# Patient Record
Sex: Female | Born: 1935 | Race: White | Hispanic: No | Smoking: Never smoker
Health system: Southern US, Community
[De-identification: ages and names within clinical notes are randomized; demographics above are authoritative.]

## PROBLEM LIST (undated history)

## (undated) DIAGNOSIS — I10 Essential (primary) hypertension: Secondary | ICD-10-CM

## (undated) DIAGNOSIS — Z22322 Carrier or suspected carrier of Methicillin resistant Staphylococcus aureus: Secondary | ICD-10-CM

## (undated) DIAGNOSIS — Z9289 Personal history of other medical treatment: Secondary | ICD-10-CM

## (undated) DIAGNOSIS — G459 Transient cerebral ischemic attack, unspecified: Secondary | ICD-10-CM

## (undated) DIAGNOSIS — Z950 Presence of cardiac pacemaker: Secondary | ICD-10-CM

## (undated) DIAGNOSIS — E079 Disorder of thyroid, unspecified: Secondary | ICD-10-CM

## (undated) DIAGNOSIS — E785 Hyperlipidemia, unspecified: Secondary | ICD-10-CM

## (undated) DIAGNOSIS — H548 Legal blindness, as defined in USA: Secondary | ICD-10-CM

## (undated) DIAGNOSIS — F0390 Unspecified dementia without behavioral disturbance: Secondary | ICD-10-CM

## (undated) DIAGNOSIS — I495 Sick sinus syndrome: Secondary | ICD-10-CM

## (undated) HISTORY — DX: Sick sinus syndrome: I49.5

## (undated) HISTORY — DX: Personal history of other medical treatment: Z92.89

---

## 2007-07-07 ENCOUNTER — Inpatient Hospital Stay (HOSPITAL_COMMUNITY): Admission: EM | Admit: 2007-07-07 | Discharge: 2007-07-13 | Payer: Self-pay | Admitting: Emergency Medicine

## 2007-07-08 ENCOUNTER — Encounter (INDEPENDENT_AMBULATORY_CARE_PROVIDER_SITE_OTHER): Payer: Self-pay | Admitting: Emergency Medicine

## 2007-07-11 ENCOUNTER — Ambulatory Visit: Payer: Self-pay | Admitting: Surgery

## 2007-07-11 ENCOUNTER — Encounter (INDEPENDENT_AMBULATORY_CARE_PROVIDER_SITE_OTHER): Payer: Self-pay | Admitting: Internal Medicine

## 2007-08-04 DIAGNOSIS — I495 Sick sinus syndrome: Secondary | ICD-10-CM

## 2007-08-04 HISTORY — DX: Sick sinus syndrome: I49.5

## 2007-09-28 ENCOUNTER — Inpatient Hospital Stay (HOSPITAL_COMMUNITY): Admission: EM | Admit: 2007-09-28 | Discharge: 2007-10-01 | Payer: Self-pay | Admitting: Emergency Medicine

## 2007-09-28 ENCOUNTER — Ambulatory Visit (HOSPITAL_COMMUNITY): Admission: RE | Admit: 2007-09-28 | Discharge: 2007-09-28 | Payer: Self-pay | Admitting: Family Medicine

## 2007-09-29 HISTORY — PX: PACEMAKER PLACEMENT: SHX43

## 2007-11-25 ENCOUNTER — Encounter: Admission: RE | Admit: 2007-11-25 | Discharge: 2007-11-25 | Payer: Self-pay | Admitting: Family Medicine

## 2008-03-27 ENCOUNTER — Encounter: Admission: RE | Admit: 2008-03-27 | Discharge: 2008-03-27 | Payer: Self-pay | Admitting: Neurology

## 2008-07-12 DIAGNOSIS — Z9289 Personal history of other medical treatment: Secondary | ICD-10-CM

## 2008-07-12 HISTORY — DX: Personal history of other medical treatment: Z92.89

## 2009-10-22 ENCOUNTER — Encounter: Admission: RE | Admit: 2009-10-22 | Discharge: 2009-10-22 | Payer: Self-pay | Admitting: Cardiovascular Disease

## 2010-12-16 NOTE — Discharge Summary (Signed)
Grace Silva, MULKERN NO.:  [id number]   MEDICAL RECORD NO.:  [id number]          PATIENT TYPE:  INP   LOCATION:  4729                         FACILITY:  [facility]   PHYSICIAN:  Wilson Singer, M.D.DATE OF BIRTH:  1918/10/29   DATE OF ADMISSION:  07/07/2007  DATE OF DISCHARGE:  07/13/2007                               DISCHARGE SUMMARY   FINAL DISCHARGE DIAGNOSES:  1. Syncope possibly related to sick sinus syndrome.  2. Large chronic right posterior communicating artery territory      stroke.  3. Hypothyroidism.   CONDITION ON DISCHARGE:  Stable.   MEDICATIONS ON DISCHARGE:  1. Norvasc 5 mg daily.  2. Aspirin 81 mg daily.  3. Hydrochlorothiazide 12.5 mg daily.  4. Captopril 50 mg t.i.d.  5. Metoprolol 12.5 mg b.i.d.  6. Zocor 40 mg daily.  7. Levothyroxine 88 mcg daily.   CONDITION ON DISCHARGE:  Stable.   HISTORY:  This very pleasant 77 year old lady was admitted with a  syncopal episode.  She cannot really remember exactly what happened.  Please see initial history and physical examination done by Dr. Eda Paschal.   HOSPITAL PROGRESS:  The patient was admitted and monitored and serial  cardiac enzymes were negative.  She also underwent a CT scan of the  head, which essentially was negative.  She then proceeded to have an MRI  of the brain, which showed a chronic large right posterior communicating  artery territory stroke.  She also underwent MRA of the neck, which  showed moderate to severe stenosis of the supraclinoid right internal  carotid artery and moderate stenosis of the right posterior  communicating artery.  It was noted that TSH was suppressed, so her  thyroid dosage was reduced.  She was also seen by neurology, who felt  that she has asymptomatic intracranial atherosclerotic disease and that  the stroke was remote.  The neurologist recommended aspirin daily and  risk factor modification.  She was reviewed by occupational and physical  therapy,  who did not feel that any further intervention was warranted.  Also, she had episodes of bradycardia and tachycardia.  On this basis,  the cardiologist felt that she may well have sick sinus syndrome.  She  underwent echocardiogram, which essentially showed an ejection fraction  of 55-60%.  There were no left regional wall abnormalities.  The left  ventricular wall thickness was mildly increased.  Aortic valve thickness  was mildly increased.  There were no other major abnormalities.  The  cardiologist, Dr. Jenne Campus, wants to see the patient as an outpatient for  a stress test and further evaluation once she is out of the hospital.  I  have encouraged the patient to make sure she keeps this appointment.  On  the day prior to discharge, her electrolytes were essentially in the  normal range with BUN 13, creatinine 0.8.  Hemoglobin 12.6, white blood  cell count 5.0, platelets 140.   Further disposition, as mentioned above, is important to make sure her  risk factors are modified and kept under good control, and she must  follow up with cardiology for  further testing to see if she actually  does require a permanent pacemaker.      Wilson Singer, M.D.  Electronically Signed     NCG/MEDQ  D:  07/13/2007  T:  07/13/2007  Job:  [id number]   cc:   Ursula Beath, MD  Darlin Priestly, MD

## 2010-12-16 NOTE — H&P (Signed)
Grace Silva, Grace Silva NO.:  [id number]   MEDICAL RECORD NO.:  [id number]          PATIENT TYPE:  INP   LOCATION:  1830                         FACILITY:  [facility]   PHYSICIAN:  Hind I Elsaid, MD      DATE OF BIRTH:  04/24/1920   DATE OF ADMISSION:  07/07/2007  DATE OF DISCHARGE:                              HISTORY & PHYSICAL   CHIEF COMPLAINT:  Syncope.   HISTORY OF PRESENT ILLNESS:  This is an 77 year old female, with a  history of hyperlipidemia, hypertension.  History of hypothyroidism,  admitted today through the emergency room with a diagnosis of syncope.  History was mainly obtained from the son as the patient cannot remember  exactly what happened.  As per the son, the patient was doing okay  during the morning.  She was sitting on the kitchen table, sewing some  clothes.  He came by, and he noticed that patient was passed out.  As  the son, he tried to wake the patient multiple times, but without any  response.  The patient asked to call the ambulance.  According to the  son, symptoms lasted about 30 minutes.  The son denies any sweating,  denies any seizure activity and denies any of loss of consciousness and  denies any loss of bowel habits during the episode.  When the EMS  arrived, the patient found to have a heart rate of 40, and patient was  paced en route to the hospital at 60 beats per minute.  The patient was  arousable on arrival.  The patient, at this time, denies any chest pain.  Denies any shortness of breath.  Denies any nausea or vomiting or  abdominal pain.  Denies any numbness or weakness of her extremities.  As  I mentioned, patient cannot remember exactly what happened.  The patient  was breathing spontaneously.  The patient given 5 mg of Versed en route.  Also, patient given 0.5 mg of atropine en route.  According to the son,  the patient did see her primary care physician on Monday, December the  1st, where he prescribed a new blood  pressure medication.  On reviewing  the medications, most probably it is Norvasc 5 mg p.o.   MEDICATIONS:  The patient's home medications:  1. Atenolol 50 mg p.o. daily.  2. Captopril 50 mg p.o. 3 times a day.  3. Zetia 10 mg p.o. q.h.s.  4. Amlodipine 5 mg p.o. daily.  5. Hydrochlorothiazide 50 mg p.o. daily.  6. Synthroid 100 mcg p.o. daily.  7. Meclizine.  8. Tramadol.   ALLERGIES:  NO KNOWN DRUG ALLERGIES.   PAST SURGICAL HISTORY:  Had a history of eye surgery and hysterectomy.   SOCIAL HISTORY:  The patient is a widow.  She lives with her son.  Denies any alcohol or drug abuse.   EXAMINATION:  VITAL SIGNS:  Temperature was 96.3.  Blood pressure  127/58.  Pulse rate 59.  Respiratory rate 24.  Saturating 100% on room  air.  GENERAL:  The patient lying comfortable on bed.  No respiratory distress  or  shortness of breath.  HEENT:  Normocephalic, atraumatic.  Pupils equal, reactive to light and  accommodation.  Extraocular muscle movement was in normal.  Mucosa is  dry.  NECK:  Supple.  No JVD and ears, nose, throat within normal.  HEART:  S1 and S2.  No other sounds.  LUNG EXAMINATION:  Normal vesicular breathing with equal air entry.  ABDOMEN:  Soft, nontender.  Bowel sounds positive.  EXTREMITIES:  No lower extremity edema and peripheral pulses intact.  CNS:  The patient alert and oriented x2 and moves all her extremities,  and there is no evidence of any neurological deficit at this time.   LABORATORY DATA:  BNP was 186.  CMV is 4140.  Potassium 3.7.  Chloride  109.  CO2 24.  Glucose 96.  BUN 15.  Creatinine 1.05.  Total bilirubin  0.5.  AST 19.  ALT 9.  Total protein 5.2.  Albumin 3.1 and calcium 8.2.  CBC:  White blood cell 6.9.  Hemoglobin 13.9.  Hematocrit 40.4 and  platelets 114.  PT 1.1.  Creatinine 1.4.   EKG showed sinus bradycardia with right bundle branch block.   ASSESSMENT AND PLAN:  This is an 77 year old female, with a history of  hypertension,  hyperlipidemia, admitted to the hospital today, with  episodes of syncope that lasted 30 minutes.  Found to have pulse rate of  40 and is status post transcutaneous pacemaker.  Received atropine and  versed en route to the hospital.  At this period in time, patient is  asymptomatic.   PROBLEM:  Syncope, which is most probably secondary to bradycardia.  Cannot rule out transient ischemia attack.  We will admit the patient to  telemetry.  We will discontinue all beta blockers and calcium channel  blockers.  We will get cardiac enzymes and 2D echo for evaluation.  Bradycardia possibly related to the medications and also possibility of  underlying ischemia.  We will consult Cardiology.  Also, we will get CT  head to rule out any stroke, which is unlikely on the differential.  We  will get also orthostatic blood pressure.  We will hold off on any  antihypertensive medication at this time as the blood pressure is  reasonably controlled.  We will resume captopril or hydrochlorothiazide  if the blood pressure is elevated.  Also, we will check a thyroid  function test.  We will resume patient's Synthroid and hyperlipidemic  medication.  DVT and GI prophylaxis.      Hind Bosie Helper, MD  Electronically Signed     HIE/MEDQ  D:  07/07/2007  T:  07/07/2007  Job:  [id number]

## 2010-12-16 NOTE — H&P (Signed)
Grace Silva, Grace Silva NO.:  [id number]   MEDICAL RECORD NO.:  [id number]          PATIENT TYPE:  INP   LOCATION:  3106                         FACILITY:  [facility]   PHYSICIAN:  Wilson Singer, M.D.DATE OF BIRTH:  July 25, 1919   DATE OF ADMISSION:  09/28/2007  DATE OF DISCHARGE:                              HISTORY & PHYSICAL   HISTORY:  This is an 77 year old very pleasant lady who lives  independently on her own, who apparently had some kind of syncopal  episode today, fell and has sustained left facial bruising with no  obvious fracture, but bilateral subarachnoid hemorrhages are seen on her  CT head scan.  Currently, she is able to give me a full story.  She  cannot remember why she fell, but she does not think that she lost  consciousness.  She certainly denies tripping on anything or slipping.   PAST MEDICAL HISTORY:  1. Significant for hypertension.  2. Hypothyroidism.  3. Hyperlipidemia.  4. Cerebrovascular disease with an old large right posterior      communicating artery territory stroke.  5. Also a possible history of sick sinus syndrome which was thought to      be the case in December 2008 when she was admitted for a syncopal      episode then.   PAST SURGICAL HISTORY:  1. Hysterectomy.  2. Eye surgery.   MEDICATIONS:  The patient is unable to remember all the medications, but  from the previous list from December 2008, she should be on:  1. Norvasc 5 mg daily.  2. Aspirin 81 mg daily.  3. HCTZ 12.5 mg daily.  4. Captopril 50 mg t.i.d.  5. Metoprolol 12.5 mg b.i.d.  6. Zocor 40 mg daily.  7. Levothyroxine 88 mcg daily.   SOCIAL HISTORY:  She lives on her own.  She does not smoke and does not  drink alcohol.  She is widowed.   FAMILY HISTORY:  Noncontributory.   REVIEW OF SYSTEMS:  Apart from the symptom mentioned above, there are no  other positive symptoms.   PHYSICAL EXAMINATION:  VITAL SIGNS:  Temperature 98, blood pressure  199/104 on initial evaluation, pulse 110, looks like she was in atrial  flutter, but now appears to be in sinus rhythm, respiratory rate 12-14,  saturation 100%.  GENERAL:  She is completely alert and oriented at the present time.  CARDIOVASCULAR:  Heart sounds are present and appear to be in sinus  rhythm.  No murmurs.  RESPIRATORY:  Lung fields are clear.  ABDOMEN:  Soft, nontender with no hepatosplenomegaly.  NEUROLOGIC:  She is alert and oriented with no focal neurological signs.   DIAGNOSTICS:  CT head scan reveals small bilateral subarachnoid  hemorrhages.   LABORATORY DATA:  Hemoglobin 14.8, white blood cell count 11.3,  platelets 196, sodium 138, potassium 3.0, chloride 100, bicarbonate 25,  glucose 107, BUN 17.   IMPRESSION:  1. Syncopal episode, possible arrhythmia.  2. Bilateral subarachnoid hemorrhages.  3. Facial bruising.  4. Uncontrolled hypertension.  5. Hypothyroidism.  6. Hyperlipidemia.  7. History of cerebrovascular disease.   PLAN:  1. Admit to neuro intensive unit.  2. Neurological observations.  3. Control blood pressure.  4. Cardiology consultation to see if this requires further evaluation.  5. Neurosurgery has been called, but I do not imagine that she is a      surgical candidate at this point.   Further recommendations will depend on patient's hospital progress.      Wilson Singer, M.D.  Electronically Signed     NCG/MEDQ  D:  09/28/2007  T:  09/29/2007  Job:  [id number]   cc:   Darlin Priestly, MD

## 2010-12-16 NOTE — Op Note (Signed)
Grace Silva, BIBER NO.:  [id number]   MEDICAL RECORD NO.:  [id number]          PATIENT TYPE:  INP   LOCATION:  3106                         FACILITY:  [facility]   PHYSICIAN:  Darlin Priestly, MD  DATE OF BIRTH:  [birth year]   DATE OF PROCEDURE:  09/29/2007  DATE OF DISCHARGE:                               OPERATIVE REPORT   PROCEDURE:  Insertion of a Medtronic EnRhythm P1501-DR generator, serial  number [id number], with passive atrial and ventricular leads.   ATTENDING STAFF:  Darlin Priestly, MD   COMPLICATIONS:  None.   INDICATIONS:  Ms. Grace Silva is an 77 year old female with a history of sick  sinus syndrome, recent admission in December with a syncopal episode  while sitting at a sewing table.  She was noted at that time to have a  heart rate the 40s on a low-dose beta blocker.  She did undergo  Cardiolite scan revealing no significant ischemia as well as an  echocardiogram revealing normal LV function.  She was readmitted on  September 28, 2007, after a recurrent syncopal episode where she actually  fell off of her porch, striking her right face.  She was noted to have  small a subarachnoid hemorrhage as well as a chronic subdural hematoma.  Because of her recurrent syncopal episodes with documented sick sinus  syndrome, she is now referred for dual-chamber pacer implant..   The left chest was then prepped and draped in a sterile fashion.  Lidocaine 1% was used to anesthetize the mid-left subclavicular area.  Next an approximately 3-cm horizontal infraclavicular incision was then  carried out and hemostasis was obtained with cautery.  Blunt dissection  was used to carry this down to the pectoral fascia.  Next an  approximately 3 x 4-cm pocket was then created over the left pectoral  fascia and again hemostasis was obtained with electrocautery.  The left  subclavian vein was then easily entered x2 with two retained guidewires.  Four silk sutures were  placed at the base of the retained guidewires.  Over the first retained guidewire a 7-French dilator and sheath was then  easily inserted in the left subclavian vein and the dilator and  guidewire were removed.  Through this a 52-cm passive Medtronic lead,  model [id number], serial number [id number], was then passed into the right  atrium and the peel-away sheath removed.  Over the second retained  guidewire a second 7-French dilator and sheath were then easily passed  into the left subclavian vein and the dilator and guidewire removed.  Through this a 45-cm passive Medtronic lead, model number 4592, serial  number [id number], was then passed into right atrium and the peel-away  sheath was removed.  A J curve was then placed in the ventricular lead  stylet and the ventricular lead was then positioned the RV apex and  thresholds were determined.  R waves were measured at 10.2 __________,  impedance 927 ohms.  Threshold in the ventricle was 0.3 V at 0.5 msec.  Current was 0.3 mA.  Ten volts were negative for diaphragmatic  stimulation.  The right atrial stylet  was then pulled back and the  preformed atrial J was then allowed to form in the atrial lead and the  atrial lead was then positioned in the right atrial appendage and  thresholds were determined.  P waves were measured at 3.2 __________,  impedance 520 ohms.  Threshold in the atrium was 0.6 V at 0.5 msec.  Current is 1.2 mA.  Again 10 volts negative for diaphragmatic  stimulation.  Two silk sutures were then used to anchor the atrial lead  to the pectoral fascia.  Kanamycin 1% solution was used to copiously  irrigate the pocket.  The hemostasis was again confirmed.  The leads  were then connected in a serial fashion to a Medtronic EnRhythm P1501-DR  generator, serial [id number].  Head screws were tightened and pacing  was confirmed.  A single silk suture then placed in the superior aspect  of the pocket.  The generator leads were then  delivered into the pocket  and the header was secured with silk suture.  The subcutaneous layer was  then closed using running 2-0 Vicryl.  The skin was closed using running  4-0 Vicryl.  Steri-Strips were applied.  The patient was transferred to  the recovery room in stable condition.   CONCLUSIONS:  Successful implant of a Medtronic EnRhythm P1501-DR  generator, serial [id number]. with passive atrial and ventricular leads.      Darlin Priestly, MD  Electronically Signed     RHM/MEDQ  D:  09/29/2007  T:  09/30/2007  Job:  [id number]

## 2010-12-16 NOTE — Consult Note (Signed)
NAMENEKEYA, BRISKI NO.:  [id number]   MEDICAL RECORD NO.:  [id number]          PATIENT TYPE:  INP   LOCATION:  4729                         FACILITY:  [facility]   PHYSICIAN:  Casimiro Needle L. Reynolds, M.D.DATE OF BIRTH:  1919-04-23   DATE OF CONSULTATION:  07/12/2007  DATE OF DISCHARGE:                                 CONSULTATION   REQUESTING PHYSICIAN:  Wilson Singer, M.D.   REASON FOR EVALUATION:  Old stroke by imaging.   HISTORY OF PRESENT ILLNESS:  This is the initial inpatient consultation  evaluation of this 77 year old woman with a past medical history which  includes hypertension and hyperlipidemia.  The patient was admitted  after being found poorly responsive by her son.  She cannot recall  exactly what happened but apparently her son found her passed out and  was unable to arouse her.  She was out for about 30 minutes.  There was  no associated diaphoresis or convulsion.  She was found by EMS to have a  heart rate of 40 and she was paced en route to the hospital.  There was  no associated chest pain or shortness of breath.  She had been started  on a new medication for blood pressure a few days prior but is not able  to recall exactly what it was.  In the hospital her medications have been adjusted, particularly her  beta-blocker has been held.  She is feeling well and has had no further  bouts of syncope.  Her heart rate has been better controlled.  As a  matter of fact, she had no focal complaints and no findings on  examination.  She had an MRI of the brain performed on July 08, 2007,  which demonstrated a remote right PCA territory infarct.  She did have  an MRA of the intracranial circulation and MRA of the neck vessels on  July 10, 2007, which demonstrated widely patent carotid and vertebral  vessels with moderate to severe stenosis in the right posterior cerebral  artery and the supraclinoid region of the right internal carotid artery.  Neurologic consultation was subsequently requested.  The patient herself denied any known history of stroke.  She does recall  some episodes several months ago when she was fairly sick and several  family members were fairly sick and she was sleepy all the time.  She  also states she has had some vague vision problems for the last several  months which have led to some difficulty having her driver's license  renewed.  She said that she saw an eye doctor and was told that she had  a little bit of glaucoma.  She denies any history of transient focal  weakness, sensory change, or visual change.   PAST MEDICAL HISTORY:  Remarkable for hypertension for which she has had  recent medication adjustments.  The patient denies any history of heart  disease and was not under the regular care of a cardiologist prior to  this admission.  She has a history of hypothyroidism and  hypercholesterolemia.   FAMILY HISTORY:  Per admission H&P and  the accompanying nursing records.   SOCIAL HISTORY:  Per admission H&P and the accompanying nursing records.   REVIEW OF SYSTEMS:  Per admission H&P and the accompanying nursing  records.   MEDICATIONS:  Prior to admission she was taking Atenolol, Captopril,  Zetia, amlodipine, HCTZ, Synthroid, and p.r.n. meclizine, and Tramadol.  I the hospital she is receiving Lovenox, low dose aspirin, Protonix,  Senokot, HCTZ, Captopril, Norvasc, Lopressor, Zocor, levothyroxine.   PHYSICAL EXAMINATION:  VITAL SIGNS:  Temperature 97.8, blood pressure  134/65, pulse 70, respirations 18, O2 sat 99% on 2 liters O2 nasal  cannula.  GENERAL:  This is a healthy-appearing woman seated in the hospital bed  in no evident distress.  HEAD:  Cranium is normocephalic and atraumatic.  Oropharynx benign.  NECK:  Supple without carotid or supraclavicular bruits.  HEART:  Regular rate and rhythm without murmurs.  NEUROLOGIC:  Mental status:  She is awake and alert.  She is oriented to   month and year but identifies the hospital incorrectly as [facility].  She is able to follow one and two-step commands.  She can  name objects and repeat phrases without difficulty.  Cranial nerves:  Pupils equal and reactive.  Extraocular movements full without  nystagmus.  She has full visual fields and there is no evidence of  visual field neglect on examination.  Hearing is intact to  conversational speech.  Facial sensation is intact to pinprick.  Face,  tongue, and palate move normally and symmetrically.  Motor:  Normal bulk  and tone.  Normal strength in all tested extremity muscles.  Sensation  intact to pinprick and double simultaneous stimulation all extremities.  Coordination:  Rapid movements performed accurately.  Finger-to-nose  performed accurately.  Gait is deferred.  Reflexes 2 plus and symmetric.  Toes are downgoing bilaterally.   LABORATORY REVIEW:  Labs for daily notes.  MRI of the brain performed  July 08, 2007, is reviewed.  The study is remarkable primarily for a  sizable old right PCA territory infarct with some scattered small vessel  disease and atrophy but with no acute finding.  The MRA of the neck is  basically unremarkable.  The MRA of the head demonstrates stenosis of  the right PCA which is the territory affected by the stroke, and the  supraclinoid segment of the right internal carotid artery.   IMPRESSION:  1. Syncope secondary to bradycardia.  2. Asymptomatic intracranial cerebrovascular disease.   RECOMMENDATIONS:  1. Aspirin daily.  2. Risk factor control with Coumadin, control of hypertension, control      of hyperglycemia if present, LDL less than 100, and treatment of      hyperhomocysteinemia and sleep apnea if present.  This can be      accomplished by her primary care physician.   Thank you for this consultation.      Michael L. Thad Ranger, M.D.  Electronically Signed     MLR/MEDQ  D:  07/12/2007  T:  07/12/2007  Job:   [id number]   cc:   Wilson Singer, M.D.

## 2010-12-19 NOTE — Discharge Summary (Signed)
Grace Silva, BUTCHKO NO.:  [id number]   MEDICAL RECORD NO.:  [id number]          PATIENT TYPE:  INP   LOCATION:  5530                         FACILITY:  [facility]   PHYSICIAN:  Elliot Cousin, M.D.    DATE OF BIRTH:  12/17/1918   DATE OF ADMISSION:  09/28/2007  DATE OF DISCHARGE:  10/01/2007                               DISCHARGE SUMMARY   DISCHARGE DIAGNOSES:  1. Recurrent syncope thought to be secondary to sick sinus syndrome.  2. Sick sinus syndrome status post pacemaker insertion by Dr. Jenne Campus      on September 29, 2007.  3. Brief episode of supraventricular tachycardia.  4. Small bilateral subarachnoid hemorrhages and possible hemorrhagic      contusions of the left anterior frontal lobe secondary to the      syncopal fall.  5. Large left periorbital hematoma secondary to syncopal fall.  6. Distal right thumb fracture as a consequence of the fall.  7. Hypertension.  8. Hypothyroidism (low TSH but normal free T4).   DISCHARGE MEDICATIONS:  1. Amlodipine 10 mg daily.  2. Aspirin (hold for one or two weeks or per the discretion of your      primary care physician and/or cardiologist).  3. Captopril 50 mg t.i.d.  4. Zocor 40 mg q.h.s.  5. Hydrochlorothiazide 12.5 mg daily.  6. Synthroid 88 mcg daily.  7. Metoprolol 25 mg half a tablet b.i.d.  8. Tylenol #3, take one tablet every four hours as needed.   CONSULTATIONS:  1. Dr. Lenise Herald.  2. Neurosurgeon.   PROCEDURES PERFORMED:  1. Insertion of pacemaker by Dr. Jenne Campus on September 29, 2007 (see the      operative report).  2. CT scan of the head without contrast on October 01, 2007.  The      results revealed resolving small volume subarachnoid hemorrhage      without acute intracranial abnormality.  Resolving large left      facial hematoma.  Stable advanced ischemic disease.  3. Chest x-ray September 30, 2007.  The results revealed no acute      cardiopulmonary process.  4. CT scan of the head  on September 29, 2007.  The results revealed      little change in scattered areas of subarachnoid hemorrhage.  No      hydrocephalus.  Slightly larger left periorbital hematoma.  Stable      chronic changes of the brain.  5. CT scan of the head without contrast on September 28, 2007.  The      results revealed small volume bilateral post-traumatic subarachnoid      hemorrhage.  Possible 6 mm hemorrhagic contusion of the left      anterior frontal lobe.  No mass effect.  Large left face and scalp      soft tissue injury including hematomas and lacerations.  No      associated fracture.  6. CT scan of the cervical spine on September 28, 2007.  The results      revealed chronic degenerative changes but no acute injury.  7. CT scan of the  face without contrast on September 28, 2007.  The      results revealed no acute facial fracture.   HISTORY OF PRESENT ILLNESS:  The patient is an 78 year old woman with a  past medical history significant for cerebrovascular disease,  hypothyroidism, hypertension, and a recent diagnosis of sick sinus  syndrome.  She presented to the emergency department on September 28, 2007 after apparently passing out at home, falling, and sustaining head  and face injuries.  When the patient was evaluated in the emergency  department, she was found to be alert and oriented.  A number of CT  scans were ordered, and in essence the CT scan of the head revealed  bilateral small subarachnoid hemorrhages.  The patient was therefore  admitted for further evaluation and management.   For additional details please see the dictated History and Physical.   HOSPITAL COURSE:  1. Bilateral subarachnoid hemorrhages, possible hemorrhagic contusion      of the left anterior frontal lobe, and large periorbital hematoma.      At the time of the initial hospital assessment the patient was      neurologically intact.  She was afebrile and mildly tachycardic      with a heart rate of 110  beats per minute.  She was also      hypertensive with a blood pressure of 199/104.  As indicated above      the CT scan of the head revealed bilateral subarachnoid hemorrhages      along with a possible left frontal hemorrhagic contusion and a      large left periorbital hematoma.  Neurosurgery was consulted and      recommended conservative management and serial CT scans of the head      for reassessment.  The patient was treated symptomatically with      analgesics during the hospital course.  Neurological checks were      ordered as well.  Her hemoglobin and hematocrit were monitored      closely.  The patient remained neurologically intact without any      evidence of focal deficits.  The follow-up CT scan of the head      revealed no significant changes.  However, the CT scan of the head      that was performed on October 01, 2007 did reveal some resolving      hematoma and hemorrhage.  Of note during hospital course aspirin      was discontinued.  Lovenox was not started.  However, the patient      was treated for DVT prophylaxis with compression stockings.  Prior      to the hospital discharge the patient was evaluated by the physical      and occupational therapists.  They recommended a rolling walker for      home and assistance from family members and friends; otherwise, no      further therapy in the outpatient setting was recommended.  The      patient was advised to not restart aspirin therapy for another week      or two or per the discretion of her primary care physician at      [facility] or cardiologist Dr. Jenne Campus.  The      patient voiced understanding.  A follow-up CT scan of the head is      recommended in 4-6 weeks to assess for interval resolution.  2. Distal right thumb fracture.  Apparently a  splint was placed on the      patient's right hand and thumb in the emergency department.  An      orthopedic surgeon was not consulted during the hospital  course.      The patient was advised to follow up with her primary care      physician at [facility], and if an orthopedic      consultation was needed it could be obtained in the outpatient      setting.  The patient's pain was treated with Tylenol No. 3 during      the hospital course.  Tylenol with Codeine appeared to have treated      her pain relatively well.  3. Recurrent syncope felt to be secondary to sick sinus syndrome.      Although the patient's heart rate was in the 100's  initially in      the emergency department, the patient had been recently diagnosed      with sick sinus syndrome in December 2008 by the [facility]      staff.  At that time a pacemaker was considered, but not inserted.      It was felt that the patient experienced syncope as a consequence      of sick sinus syndrome in December 2008.  The patient, however, was      sent home with medical management.  Because of this recent history      cardiologist  Dr. Jenne Campus was consulted during this hospitalization.  The decision was  made to insert a pacemaker.  The pacemaker was inserted on September 29, 2007 successfully.  The patient tolerated it well, although she did have  some left upper chest wall pain.  Also during the hospital course the  patient experienced a brief run of supraventricular tachycardia.  Metoprolol was restarted as was her other chronic medications.  Prior to  hospital discharge the patient's heart rate was ranging between 70 and  90 beats per minute.  She had no further episodes of supraventricular  tachycardia during the remainder of the hospitalization.  The patient  was advised to follow up with  Dr. Jenne Campus as scheduled by the physician assistant for [facility].  1. Hypertension.  The patient was initially very hypertensive in the      emergency department.  She was started on Norvasc, lisinopril, and      metoprolol.  The dose  of the metoprolol was increased to 25 mg      b.i.d.  However it was realized that the dosing of the ACE      inhibitor was somewhat lower than what she had been taking at home.      Therefore prior to hospital discharge the patient was advised to      restart captopril as well as hydrochlorothiazide and to increase      the amlodipine to 10 mg daily, her usual dose.  The metoprolol was      decreased back down to 12.5 mg b.i.d., the dose that she had been      on prior to the hospital admission.  2. Hypothyroidism.  The patient is treated with Synthroid chronically.      Her TSH was assessed and found to be low at 0.171.  A free T4 and      total T3 were ordered for further evaluation.  The free T4 was      within normal limits  at 1.07, and a total T3 was within normal      limits at 2.3.  The patient was maintained on her usual dose of      Synthroid.      Elliot Cousin, M.D.  Electronically Signed     DF/MEDQ  D:  10/02/2007  T:  10/03/2007  Job:  [id number]   cc:   Darlin Priestly, MD

## 2011-04-24 LAB — CBC
HCT: 43.2
Hemoglobin: 11.9 — ABNORMAL LOW
Hemoglobin: 12.3
Hemoglobin: 13.8
MCHC: 33.9
MCHC: 34
MCHC: 34.2
MCV: 88.5
MCV: 89.6
MCV: 89.8
Platelets: 191
RBC: 4.03
RDW: 13.3
RDW: 13.4
RDW: 13.5

## 2011-04-24 LAB — BASIC METABOLIC PANEL
CO2: 26
Calcium: 8.3 — ABNORMAL LOW
Chloride: 100
Creatinine, Ser: 0.8
Creatinine, Ser: 1
GFR calc Af Amer: >60
Glucose, Bld: 107 — ABNORMAL HIGH
Glucose, Bld: 97
Sodium: 138
Sodium: 143

## 2011-04-24 LAB — COMPREHENSIVE METABOLIC PANEL
ALT: 14
Albumin: 3.6
Alkaline Phosphatase: 71
Potassium: 3.9
Sodium: 139
Total Protein: 6

## 2011-04-24 LAB — APTT: aPTT: 42 — ABNORMAL HIGH

## 2011-04-24 LAB — PROTIME-INR
INR: 1.1
Prothrombin Time: 14.5

## 2011-04-24 LAB — POCT CARDIAC MARKERS
Operator id: 196461
Troponin i, poc: <0.05

## 2011-04-24 LAB — CARDIAC PANEL(CRET KIN+CKTOT+MB+TROPI)
CK, MB: 4.4 — ABNORMAL HIGH
Relative Index: 2.9 — ABNORMAL HIGH
Troponin I: 0.04

## 2011-04-24 LAB — TROPONIN I: Troponin I: 0.06

## 2011-05-11 LAB — COMPREHENSIVE METABOLIC PANEL WITH GFR
ALT: 9
AST: 19
CO2: 24
Calcium: 8.2 — ABNORMAL LOW
Chloride: 109
GFR calc Af Amer: >60
GFR calc non Af Amer: 50 — ABNORMAL LOW
Potassium: 3.7
Sodium: 140

## 2011-05-11 LAB — BASIC METABOLIC PANEL WITH GFR
BUN: 12
Calcium: 8.5
Calcium: 8.6
Creatinine, Ser: 0.89
GFR calc Af Amer: >60
GFR calc non Af Amer: >60
GFR calc non Af Amer: >60
Glucose, Bld: 91

## 2011-05-11 LAB — URINALYSIS, ROUTINE W REFLEX MICROSCOPIC
Bilirubin Urine: NEGATIVE
Glucose, UA: NEGATIVE
Hgb urine dipstick: NEGATIVE
Ketones, ur: 15 — AB
Nitrite: NEGATIVE
Protein, ur: NEGATIVE
Specific Gravity, Urine: 1.016
Urobilinogen, UA: 0.2
pH: 7

## 2011-05-11 LAB — HEMOGLOBIN A1C
Hgb A1c MFr Bld: 5.7
Mean Plasma Glucose: 126

## 2011-05-11 LAB — BASIC METABOLIC PANEL
BUN: 17
CO2: 26
CO2: 27
Calcium: 8.8
Chloride: 107
Chloride: 110
Creatinine, Ser: 0.76
GFR calc Af Amer: >60
GFR calc Af Amer: >60
GFR calc non Af Amer: 53 — ABNORMAL LOW
Glucose, Bld: 115 — ABNORMAL HIGH
Glucose, Bld: 127 — ABNORMAL HIGH
Potassium: 3.3 — ABNORMAL LOW
Potassium: 3.3 — ABNORMAL LOW
Potassium: 3.5
Sodium: 141
Sodium: 141
Sodium: 144

## 2011-05-11 LAB — RAPID URINE DRUG SCREEN, HOSP PERFORMED
Amphetamines: NOT DETECTED
Barbiturates: NOT DETECTED
Benzodiazepines: POSITIVE — AB
Cocaine: NOT DETECTED
Opiates: NOT DETECTED
Tetrahydrocannabinol: NOT DETECTED

## 2011-05-11 LAB — PHOSPHORUS: Phosphorus: 3.2

## 2011-05-11 LAB — I-STAT 8, (EC8 V) (CONVERTED LAB)
Acid-Base Excess: 3 — ABNORMAL HIGH
Acid-base deficit: 3 — ABNORMAL HIGH
BUN: 15
BUN: 33 — ABNORMAL HIGH
Bicarbonate: 23.9
Bicarbonate: 27.8 — ABNORMAL HIGH
Chloride: 111
Chloride: 99
Glucose, Bld: 105 — ABNORMAL HIGH
Glucose, Bld: 94
HCT: 37
HCT: 41
Hemoglobin: 12.6
Hemoglobin: 13.9
Operator id: 285841
Operator id: 285841
Potassium: 3.8
Potassium: 4.1
Sodium: 133 — ABNORMAL LOW
Sodium: 142
TCO2: 25
TCO2: 29
pCO2, Ven: 43.4 — ABNORMAL LOW
pCO2, Ven: 48.1
pH, Ven: 7.304 — ABNORMAL HIGH
pH, Ven: 7.415 — ABNORMAL HIGH

## 2011-05-11 LAB — TSH
TSH: 0.054 — ABNORMAL LOW
TSH: 0.069 — ABNORMAL LOW

## 2011-05-11 LAB — CARDIAC PANEL(CRET KIN+CKTOT+MB+TROPI)
CK, MB: 0.9
CK, MB: 1.1
CK, MB: 1.2
Relative Index: INVALID
Relative Index: INVALID
Relative Index: INVALID
Total CK: 43
Total CK: 49
Total CK: 51
Troponin I: 0.01
Troponin I: 0.02
Troponin I: 0.03

## 2011-05-11 LAB — CBC
HCT: 37.3
HCT: 39
HCT: 40.4
Hemoglobin: 12.6
Hemoglobin: 13.4
Hemoglobin: 13.8
MCHC: 34.2
MCV: 88.4
MCV: 89.1
Platelets: 114 — ABNORMAL LOW
RBC: 4.18
RBC: 4.47
RBC: 4.57
RDW: 14.2
WBC: 5
WBC: 6.9

## 2011-05-11 LAB — COMPREHENSIVE METABOLIC PANEL
AST: 15
Albumin: 3.1 — ABNORMAL LOW
Alkaline Phosphatase: 56
BUN: 15
CO2: 30
Chloride: 108
Creatinine, Ser: 0.8
Creatinine, Ser: 1.05
GFR calc Af Amer: >60
GFR calc non Af Amer: >60
Glucose, Bld: 96
Glucose, Bld: 97
Total Bilirubin: 0.9
Total Bilirubin: 0.9
Total Protein: 5.2 — ABNORMAL LOW

## 2011-05-11 LAB — URINE MICROSCOPIC-ADD ON

## 2011-05-11 LAB — CALCIUM: Calcium: 9

## 2011-05-11 LAB — LIPID PANEL
Cholesterol: 179
HDL: 26 — ABNORMAL LOW
LDL Cholesterol: 123 — ABNORMAL HIGH
Total CHOL/HDL Ratio: 6.9
Triglycerides: 151 — ABNORMAL HIGH
VLDL: 30

## 2011-05-11 LAB — DIFFERENTIAL
Basophils Absolute: 0.1
Basophils Relative: 1
Eosinophils Absolute: 0.1 — ABNORMAL LOW
Eosinophils Relative: 1
Lymphocytes Relative: 25
Lymphs Abs: 1.7
Monocytes Absolute: 0.3
Monocytes Relative: 5
Neutro Abs: 4.7
Neutrophils Relative %: 68

## 2011-05-11 LAB — POCT I-STAT CREATININE
Creatinine, Ser: 1.4
Operator id: 285841

## 2011-05-11 LAB — POCT CARDIAC MARKERS
CKMB, poc: <1 — ABNORMAL LOW
Myoglobin, poc: 144
Operator id: 285841
Troponin i, poc: <0.05

## 2011-05-11 LAB — MAGNESIUM: Magnesium: 2

## 2011-05-11 LAB — URINE CULTURE
Colony Count: NO GROWTH
Culture: NO GROWTH

## 2011-05-11 LAB — B-NATRIURETIC PEPTIDE (CONVERTED LAB): Pro B Natriuretic peptide (BNP): 186 — ABNORMAL HIGH

## 2011-05-11 LAB — PROTIME-INR
INR: 1.1
Prothrombin Time: 14.8

## 2011-05-11 LAB — APTT: aPTT: 33

## 2011-05-11 LAB — T4, FREE: Free T4: 1.14

## 2011-05-11 LAB — T3: T3, Total: 93.5 (ref 80.0–204.0)

## 2013-02-10 ENCOUNTER — Telehealth: Payer: Self-pay | Admitting: Cardiovascular Disease

## 2013-02-10 ENCOUNTER — Encounter: Payer: Self-pay | Admitting: Cardiovascular Disease

## 2013-02-10 NOTE — Telephone Encounter (Signed)
Victorino Dike is calling because you need a prescription for the medication (Amliopine 2.5mg ) they do have a prescription on file for this

## 2013-02-13 NOTE — Telephone Encounter (Signed)
Per Aram Beecham, medical records, paper chart given to [facility], LPN.

## 2013-02-14 NOTE — Telephone Encounter (Signed)
Pt. Seen in [place] this week

## 2013-02-16 ENCOUNTER — Other Ambulatory Visit: Payer: Self-pay | Admitting: Cardiovascular Disease

## 2013-02-16 LAB — CBC WITH DIFFERENTIAL/PLATELET
Basophils Absolute: 0.1 10*3/uL (ref 0.0–0.1)
Basophils Relative: 1 % (ref 0–1)
Eosinophils Absolute: 0.1 10*3/uL (ref 0.0–0.7)
Eosinophils Relative: 2 % (ref 0–5)
HCT: 43.5 % (ref 36.0–46.0)
Hemoglobin: 14.8 g/dL (ref 12.0–15.0)
Lymphocytes Relative: 28 % (ref 12–46)
Lymphs Abs: 1.7 10*3/uL (ref 0.7–4.0)
MCH: 29.8 pg (ref 26.0–34.0)
MCHC: 34 g/dL (ref 30.0–36.0)
MCV: 87.7 fL (ref 78.0–100.0)
Monocytes Absolute: 0.5 10*3/uL (ref 0.1–1.0)
Monocytes Relative: 7 % (ref 3–12)
Neutro Abs: 3.8 10*3/uL (ref 1.7–7.7)
Neutrophils Relative %: 62 % (ref 43–77)
Platelets: 153 10*3/uL (ref 150–400)
RBC: 4.96 MIL/uL (ref 3.87–5.11)
RDW: 15.4 % (ref 11.5–15.5)
WBC: 6.1 10*3/uL (ref 4.0–10.5)

## 2013-02-16 LAB — COMPREHENSIVE METABOLIC PANEL
ALT: 9 U/L (ref 0–35)
Albumin: 4 g/dL (ref 3.5–5.2)
CO2: 26 mEq/L (ref 19–32)
Calcium: 8.7 mg/dL (ref 8.4–10.5)
Chloride: 102 mEq/L (ref 96–112)
Potassium: 3.4 mEq/L — ABNORMAL LOW (ref 3.5–5.3)
Sodium: 142 mEq/L (ref 135–145)
Total Protein: 6.1 g/dL (ref 6.0–8.3)

## 2013-02-16 LAB — PROTIME-INR
INR: 0.97 (ref <1.50)
Prothrombin Time: 12.9 seconds (ref 11.6–15.2)

## 2013-02-16 LAB — TSH: TSH: 2.338 u[IU]/mL (ref 0.350–4.500)

## 2013-02-16 LAB — APTT: aPTT: 42 seconds — ABNORMAL HIGH (ref 24–37)

## 2013-02-17 ENCOUNTER — Telehealth: Payer: Self-pay | Admitting: Cardiovascular Disease

## 2013-02-17 ENCOUNTER — Other Ambulatory Visit: Payer: Self-pay | Admitting: *Deleted

## 2013-02-17 DIAGNOSIS — Z01812 Encounter for preprocedural laboratory examination: Secondary | ICD-10-CM

## 2013-02-17 LAB — URINALYSIS, ROUTINE W REFLEX MICROSCOPIC
Glucose, UA: NEGATIVE mg/dL
Protein, ur: 30 mg/dL — AB
Specific Gravity, Urine: 1.03 (ref 1.005–1.030)

## 2013-02-17 LAB — URINALYSIS, MICROSCOPIC ONLY

## 2013-02-17 MED ORDER — CEFAZOLIN SODIUM-DEXTROSE 2-3 GM-% IV SOLR
2.0000 g | INTRAVENOUS | Status: DC
  Filled 2013-02-17: qty 50

## 2013-02-17 MED ORDER — CEFAZOLIN SODIUM-DEXTROSE 2-3 GM-% IV SOLR: 2.0000 g | INTRAVENOUS | Status: AC

## 2013-02-17 NOTE — Telephone Encounter (Signed)
Pt needs a new Rx for her medication. Please call it in to the pharmacy.

## 2013-02-17 NOTE — Telephone Encounter (Signed)
Pharmacist states that Rx was supposed to be sent over for generic Norvasc 2.5mg .  They do not have a record of this medication for this patient.  Please advise.

## 2013-02-17 NOTE — Telephone Encounter (Signed)
Pt.s script sent in by [facility] and received and picked up by pt.

## 2013-02-19 ENCOUNTER — Emergency Department (HOSPITAL_COMMUNITY): Payer: Medicare HMO

## 2013-02-19 ENCOUNTER — Inpatient Hospital Stay (HOSPITAL_COMMUNITY)
Admission: EM | Admit: 2013-02-19 | Discharge: 2013-02-22 | DRG: 064 | Disposition: A | Discharge status: Home Health | Payer: Medicare HMO | Attending: Internal Medicine | Admitting: Internal Medicine

## 2013-02-19 ENCOUNTER — Encounter (HOSPITAL_COMMUNITY): Payer: Self-pay | Admitting: *Deleted

## 2013-02-19 ENCOUNTER — Emergency Department (HOSPITAL_COMMUNITY)
Admission: EM | Admit: 2013-02-19 | Discharge: 2013-02-19 | Disposition: A | Discharge status: Home / Self Care | Payer: Medicare HMO | Source: Home / Self Care | Attending: Emergency Medicine | Admitting: Emergency Medicine

## 2013-02-19 DIAGNOSIS — R32 Unspecified urinary incontinence: Secondary | ICD-10-CM | POA: Diagnosis present

## 2013-02-19 DIAGNOSIS — Z45010 Encounter for checking and testing of cardiac pacemaker pulse generator [battery]: Secondary | ICD-10-CM

## 2013-02-19 DIAGNOSIS — I10 Essential (primary) hypertension: Secondary | ICD-10-CM

## 2013-02-19 DIAGNOSIS — R4182 Altered mental status, unspecified: Secondary | ICD-10-CM

## 2013-02-19 DIAGNOSIS — I498 Other specified cardiac arrhythmias: Secondary | ICD-10-CM

## 2013-02-19 DIAGNOSIS — G9349 Other encephalopathy: Secondary | ICD-10-CM | POA: Diagnosis present

## 2013-02-19 DIAGNOSIS — E860 Dehydration: Secondary | ICD-10-CM

## 2013-02-19 DIAGNOSIS — E039 Hypothyroidism, unspecified: Secondary | ICD-10-CM | POA: Diagnosis present

## 2013-02-19 DIAGNOSIS — H548 Legal blindness, as defined in USA: Secondary | ICD-10-CM | POA: Diagnosis present

## 2013-02-19 DIAGNOSIS — I639 Cerebral infarction, unspecified: Secondary | ICD-10-CM | POA: Diagnosis present

## 2013-02-19 DIAGNOSIS — E876 Hypokalemia: Secondary | ICD-10-CM | POA: Diagnosis present

## 2013-02-19 DIAGNOSIS — G9340 Encephalopathy, unspecified: Secondary | ICD-10-CM | POA: Diagnosis present

## 2013-02-19 DIAGNOSIS — Z66 Do not resuscitate: Secondary | ICD-10-CM | POA: Diagnosis present

## 2013-02-19 DIAGNOSIS — Z01812 Encounter for preprocedural laboratory examination: Secondary | ICD-10-CM

## 2013-02-19 DIAGNOSIS — Z7902 Long term (current) use of antithrombotics/antiplatelets: Secondary | ICD-10-CM

## 2013-02-19 DIAGNOSIS — W19XXXA Unspecified fall, initial encounter: Secondary | ICD-10-CM

## 2013-02-19 DIAGNOSIS — Z950 Presence of cardiac pacemaker: Secondary | ICD-10-CM

## 2013-02-19 DIAGNOSIS — Z79899 Other long term (current) drug therapy: Secondary | ICD-10-CM

## 2013-02-19 DIAGNOSIS — I6350 Cerebral infarction due to unspecified occlusion or stenosis of unspecified cerebral artery: Principal | ICD-10-CM | POA: Diagnosis present

## 2013-02-19 DIAGNOSIS — I495 Sick sinus syndrome: Secondary | ICD-10-CM | POA: Diagnosis present

## 2013-02-19 DIAGNOSIS — Z9181 History of falling: Secondary | ICD-10-CM

## 2013-02-19 DIAGNOSIS — R7989 Other specified abnormal findings of blood chemistry: Secondary | ICD-10-CM

## 2013-02-19 HISTORY — DX: Legal blindness, as defined in USA: H54.8

## 2013-02-19 HISTORY — DX: Presence of cardiac pacemaker: Z95.0

## 2013-02-19 HISTORY — DX: Disorder of thyroid, unspecified: E07.9

## 2013-02-19 HISTORY — DX: Essential (primary) hypertension: I10

## 2013-02-19 LAB — POCT I-STAT, CHEM 8
BUN: 14 mg/dL (ref 6–23)
Calcium, Ion: 1.07 mmol/L — ABNORMAL LOW (ref 1.13–1.30)
Chloride: 103 mEq/L (ref 96–112)
Glucose, Bld: 95 mg/dL (ref 70–99)

## 2013-02-19 LAB — COMPREHENSIVE METABOLIC PANEL
Alkaline Phosphatase: 68 U/L (ref 39–117)
BUN: 11 mg/dL (ref 6–23)
Chloride: 101 mEq/L (ref 96–112)
GFR calc Af Amer: 82 mL/min — ABNORMAL LOW (ref >90)
GFR calc non Af Amer: 71 mL/min — ABNORMAL LOW (ref >90)
Glucose, Bld: 97 mg/dL (ref 70–99)
Potassium: 3.6 mEq/L (ref 3.5–5.1)
Total Bilirubin: 0.5 mg/dL (ref 0.3–1.2)

## 2013-02-19 LAB — CBC WITH DIFFERENTIAL/PLATELET
Eosinophils Absolute: 0.1 10*3/uL (ref 0.0–0.7)
Hemoglobin: 15.2 g/dL — ABNORMAL HIGH (ref 12.0–15.0)
Lymphs Abs: 1.4 10*3/uL (ref 0.7–4.0)
MCH: 30.3 pg (ref 26.0–34.0)
Monocytes Relative: 9 % (ref 3–12)
Neutrophils Relative %: 68 % (ref 43–77)
RBC: 5.01 MIL/uL (ref 3.87–5.11)

## 2013-02-19 LAB — POCT I-STAT TROPONIN I: Troponin i, poc: 0.01 ng/mL (ref 0.00–0.08)

## 2013-02-19 LAB — URINALYSIS W MICROSCOPIC + REFLEX CULTURE
Ketones, ur: NEGATIVE mg/dL
Leukocytes, UA: NEGATIVE
Nitrite: NEGATIVE
Protein, ur: NEGATIVE mg/dL

## 2013-02-19 MED ORDER — SODIUM CHLORIDE 0.9 % IR SOLN
80.0000 mg | Status: DC
  Filled 2013-02-19: qty 2

## 2013-02-19 MED ORDER — SODIUM CHLORIDE 0.9 % IV BOLUS (SEPSIS)
500.0000 mL | Freq: Once | INTRAVENOUS | Status: AC
  Administered 2013-02-19: 500 mL via INTRAVENOUS

## 2013-02-19 MED ORDER — SODIUM CHLORIDE 0.9 % IV SOLN
Freq: Once | INTRAVENOUS | Status: AC
  Administered 2013-02-19: 18:00:00 via INTRAVENOUS

## 2013-02-19 NOTE — ED Notes (Addendum)
Per EMS: Pt from home with c/o fall in shower at 2115 (second fall today). Denies injury/pain/LOC. PRRLA. Oriented to self only, not baseline.Family reports family with increased weakness, lethargic since yesterday. Pt seen and DC earlier today from [facility] for same complaint. 72 bpm. 154/85. CBG 142

## 2013-02-19 NOTE — ED Notes (Signed)
Pt's niece brought pt to the ED, reports finding pt on the floor of her house in between her door.  She reports pt lives with her 2 sons who suffers with PTSD.  Pt denies any pain or LOC at this time.

## 2013-02-19 NOTE — ED Notes (Signed)
MD at bedside. Dr. Kohut at bedside.  

## 2013-02-19 NOTE — ED Notes (Signed)
Pt oriented to self, place, situation. Pt not sure what year it is, but knows who the president is. Pt denies pain, weakness, nausea at this time. Pt with no acute distress.

## 2013-02-19 NOTE — ED Provider Notes (Signed)
History    CSN: [id number] Arrival date & time 02/19/13  1349  First MD Initiated Contact with Patient 02/19/13 1543     Chief Complaint  Patient presents with  . Fall   (Consider location/radiation/quality/duration/timing/severity/associated sxs/prior Treatment) HPI Grace Silva is a 79 y.o. female who presents to ED with complaint of a fall. Per niece who is here with pt and providing most of history, pt was found on the floor just prior to the arrival. PT lives with her son. Son found pt on the floor behind the door, blocking it from opening it. Son was unable to open the door and called niece for help. Per niece, pt was unable to get up and they talked her through repositioning so they could open the door. Pt was helped up. Since then ambulatory. Per niece, pt was confused shortly after the episode. She is normally oriented x4, but she could not recall who the niece was or her birthday. Pt's mental status has improved by now. Pt was also incontinent of urine. Pt denies any pain. Pt denies remembering falling, but states "I fall a lot." It is unknown how long ago pt has fallen. She does not recall if she took her BP medications this morning, but states she does remember eating breakfast. Pt has no complaints.  Past Medical History  Diagnosis Date  . Thyroid disease   . Legally blind   . Hypertension   . Pacemaker    Past Surgical History  Procedure Laterality Date  . Pacemaker placement     No family history on file. History  Substance Use Topics  . Smoking status: Never Smoker   . Smokeless tobacco: Not on file  . Alcohol Use: No   OB History   Grav Para Term Preterm Abortions TAB SAB Ect Mult Living                 Review of Systems  Constitutional: Negative for fever and chills.  HENT: Negative for neck pain and neck stiffness.   Respiratory: Negative.   Cardiovascular: Negative.   Musculoskeletal: Negative for myalgias and arthralgias.  Skin: Negative for  wound.  Neurological: Positive for weakness. Negative for dizziness, light-headedness, numbness and headaches.  Hematological: Does not bruise/bleed easily.  All other systems reviewed and are negative.    Allergies  Review of patient's allergies indicates no known allergies.  Home Medications   Current Outpatient Rx  Name  Route  Sig  Dispense  Refill  . amLODipine (NORVASC) 2.5 MG tablet   Oral   Take 2.5 mg by mouth every morning.         Marland Kitchen aspirin 81 MG chewable tablet   Oral   Chew 81 mg by mouth every morning.         . captopril (CAPOTEN) 100 MG tablet   Oral   Take 100 mg by mouth 2 (two) times daily.         . carboxymethylcellulose 1 % ophthalmic solution   Ophthalmic   Apply 1 drop to eye 3 (three) times daily as needed (for dry irritation).         Marland Kitchen levothyroxine (SYNTHROID, LEVOTHROID) 75 MCG tablet   Oral   Take 75 mcg by mouth daily before breakfast.         . metoprolol succinate (TOPROL-XL) 100 MG 24 hr tablet   Oral   Take 100 mg by mouth daily. Take with or immediately following a meal.  BP 214/106  Pulse 60  Temp(Src) 98.3 F (36.8 C) (Oral)  Resp 20  SpO2 96% Physical Exam  Nursing note and vitals reviewed. Constitutional: She is oriented to person, place, and time. She appears well-developed and well-nourished. No distress.  HENT:  Head: Normocephalic and atraumatic.  Oral mucosa dry  Eyes: Conjunctivae and EOM are normal.  Left pupil larger than right, per niece, this is not new. Right pupil round and reactive to light  Neck: Normal range of motion. Neck supple.  Cardiovascular: Normal rate, regular rhythm and normal heart sounds.   Pulmonary/Chest: Effort normal and breath sounds normal. No respiratory distress. She has no wheezes. She has no rales.  Abdominal: Soft. Bowel sounds are normal. She exhibits no distension. There is no tenderness. There is no rebound.  Musculoskeletal: She exhibits no edema.  Full rom  of bilateral hips and knees. Tenderness over pelvis bilaterally. Tender over bilateral hips and left knee  Neurological: She is alert and oriented to person, place, and time. No cranial nerve deficit. Coordination normal.  5/5 and equal upper and lower extremity strength bilaterally. Equal grip strength bilaterally.No pronator drift.    Skin: Skin is warm and dry.    ED Course  Procedures (including critical care time) Labs Reviewed  CBC WITH DIFFERENTIAL - Abnormal; Notable for the following:    Hemoglobin 15.2 (*)    Platelets 145 (*)    All other components within normal limits  COMPREHENSIVE METABOLIC PANEL - Abnormal; Notable for the following:    GFR calc non Af Amer 71 (*)    GFR calc Af Amer 82 (*)    All other components within normal limits  URINALYSIS W MICROSCOPIC + REFLEX CULTURE - Abnormal; Notable for the following:    APPearance CLOUDY (*)    Hgb urine dipstick TRACE (*)    All other components within normal limits  CK - Abnormal; Notable for the following:    Total CK 222 (*)    All other components within normal limits  POCT I-STAT, CHEM 8 - Abnormal; Notable for the following:    Calcium, Ion 1.07 (*)    Hemoglobin 16.0 (*)    HCT 47.0 (*)    All other components within normal limits  POCT I-STAT TROPONIN I   Dg Chest 2 View  02/19/2013   *RADIOLOGY REPORT*  Clinical Data: Fall.  Hypertension.  CHEST - 2 VIEW  Comparison: PA and lateral chest 10/22/2009.  Findings: Heart size is normal.  Lungs are clear.  No pneumothorax or pleural fluid.  Pacing device is noted.  Degenerative disease is seen about the shoulders.  IMPRESSION: No acute disease.   Original Report Authenticated By: Holley Dexter, M.D.   Dg Hip Bilateral Vito Berger  02/19/2013   *RADIOLOGY REPORT*  Clinical Data: Status post fall.  BILATERAL HIP WITH PELVIS - 4+ VIEW  Comparison: The  Findings: No acute bony or joint abnormality is identified.  Mild degenerative change is seen about the hips.   Lower lumbar spondylosis also noted.  IMPRESSION: No acute finding.   Original Report Authenticated By: Holley Dexter, M.D.   Ct Head Wo Contrast  02/19/2013   *RADIOLOGY REPORT*  Clinical Data:  Recurrent falls.  The patient was found down.  CT HEAD WITHOUT CONTRAST CT CERVICAL SPINE WITHOUT CONTRAST  Technique:  Multidetector CT imaging of the head and cervical spine was performed following the standard protocol without intravenous contrast.  Multiplanar CT image reconstructions of the cervical spine were also generated.  Comparison:  Head CT scan 03/27/2008.  Head and cervical spine CT scan 09/28/2007.  CT HEAD  Findings: The brain is atrophic with extensive chronic microvascular ischemic change.  Large, remote right PCA territory infarct is identified.  Since the prior studies, the patient has developed a right basal ganglia infarct which appears subacute to remote.  No hemorrhage, midline shift or abnormal extra-axial fluid collection is identified.  The calvarium is intact.  IMPRESSION:  1.  Right basal ganglia infarct is new since the 2009 study and appears subacute to remote. 2.  Atrophy, extensive chronic microvascular ischemic change and remote, large right PCA territory infarct.  CT CERVICAL SPINE  Findings: No fracture or subluxation of the cervical spine is identified.  Multilevel degenerative disc disease and facet arthropathy are again seen and show some progression.   The lung apices are clear.  Atherosclerosis is noted.  IMPRESSION: No acute finding.  Multilevel degenerative change shows progression since the prior study.   Original Report Authenticated By: Holley Dexter, M.D.   Ct Cervical Spine Wo Contrast  02/19/2013   *RADIOLOGY REPORT*  Clinical Data:  Recurrent falls.  The patient was found down.  CT HEAD WITHOUT CONTRAST CT CERVICAL SPINE WITHOUT CONTRAST  Technique:  Multidetector CT imaging of the head and cervical spine was performed following the standard protocol without  intravenous contrast.  Multiplanar CT image reconstructions of the cervical spine were also generated.  Comparison:  Head CT scan 03/27/2008.  Head and cervical spine CT scan 09/28/2007.  CT HEAD  Findings: The brain is atrophic with extensive chronic microvascular ischemic change.  Large, remote right PCA territory infarct is identified.  Since the prior studies, the patient has developed a right basal ganglia infarct which appears subacute to remote.  No hemorrhage, midline shift or abnormal extra-axial fluid collection is identified.  The calvarium is intact.  IMPRESSION:  1.  Right basal ganglia infarct is new since the 2009 study and appears subacute to remote. 2.  Atrophy, extensive chronic microvascular ischemic change and remote, large right PCA territory infarct.  CT CERVICAL SPINE  Findings: No fracture or subluxation of the cervical spine is identified.  Multilevel degenerative disc disease and facet arthropathy are again seen and show some progression.   The lung apices are clear.  Atherosclerosis is noted.  IMPRESSION: No acute finding.  Multilevel degenerative change shows progression since the prior study.   Original Report Authenticated By: Holley Dexter, M.D.   Dg Knee Complete 4 Views Left  02/19/2013   *RADIOLOGY REPORT*  Clinical Data: Status post fall  LEFT KNEE - COMPLETE 4+ VIEW  Comparison: None.  Findings: There is no joint effusion.  Sharpening tibial spines and marginal spur formation is identified compatible with osteoarthritis.  No fractures or dislocations identified.  No radio-opaque foreign body or soft tissue calcification.  IMPRESSION:  1.  Osteoarthritis. 2.  No acute findings.   Original Report Authenticated By: Signa Kell, M.D.     Date: 02/19/2013  Rate: 60  Rhythm: electronically paced  Old EKG Reviewed: changes noted new paced rhythm when compared to 09/30/07   1. Pre-procedure lab exam     MDM  Pt with a fall this morning. Unknown time. Here hypertensive,  AAOx4 but denies any complaints. Unsure if took her BP medications today. She is asymptomatic for her hypertension. She lives with her son, which according to the niece is not the best care taker. Pt's blood work and imaging here showed most likely dehydration, otherwise unremarkable.  Pt is ambulatory, denies CP, dizziness. ECG is paced. Troponin is negative. UA unremarkable. Discussed with Dr. Juleen China who has seen pt. Discussed possible placement with her niece. It is Sunday evening, i tried to contact Child psychotherapist, but unable to get in touch with them. Recommended getting in contact with her PCP or cardiology. Also close follow up to recheck blood pressure.   Filed Vitals:   02/19/13 1645 02/19/13 1700 02/19/13 1751 02/19/13 1800  BP: 210/95 209/96 208/85 208/82  Pulse: 62  59 60  Temp:      TempSrc:      Resp:   15   SpO2: 99%  100% 100%     Lottie Mussel, PA-C 02/19/13 1916  Lottie Mussel, PA-C 02/19/13 2034

## 2013-02-19 NOTE — ED Notes (Signed)
Pt's daughter is requesting to have pt evaluated to be placed in an Assisted living.

## 2013-02-20 ENCOUNTER — Ambulatory Visit (HOSPITAL_COMMUNITY): Admission: RE | Admit: 2013-02-20 | Payer: Medicare HMO | Source: Ambulatory Visit | Admitting: Cardiovascular Disease

## 2013-02-20 ENCOUNTER — Encounter (HOSPITAL_COMMUNITY): Payer: Self-pay | Admitting: *Deleted

## 2013-02-20 ENCOUNTER — Inpatient Hospital Stay (HOSPITAL_COMMUNITY): Payer: Medicare HMO

## 2013-02-20 DIAGNOSIS — I6350 Cerebral infarction due to unspecified occlusion or stenosis of unspecified cerebral artery: Secondary | ICD-10-CM

## 2013-02-20 DIAGNOSIS — Z01812 Encounter for preprocedural laboratory examination: Secondary | ICD-10-CM

## 2013-02-20 DIAGNOSIS — I10 Essential (primary) hypertension: Secondary | ICD-10-CM

## 2013-02-20 DIAGNOSIS — R4182 Altered mental status, unspecified: Secondary | ICD-10-CM

## 2013-02-20 DIAGNOSIS — I498 Other specified cardiac arrhythmias: Secondary | ICD-10-CM

## 2013-02-20 DIAGNOSIS — G9340 Encephalopathy, unspecified: Secondary | ICD-10-CM

## 2013-02-20 DIAGNOSIS — R799 Abnormal finding of blood chemistry, unspecified: Secondary | ICD-10-CM

## 2013-02-20 DIAGNOSIS — I495 Sick sinus syndrome: Secondary | ICD-10-CM

## 2013-02-20 DIAGNOSIS — W19XXXA Unspecified fall, initial encounter: Secondary | ICD-10-CM

## 2013-02-20 DIAGNOSIS — I517 Cardiomegaly: Secondary | ICD-10-CM

## 2013-02-20 DIAGNOSIS — Z9289 Personal history of other medical treatment: Secondary | ICD-10-CM

## 2013-02-20 DIAGNOSIS — I639 Cerebral infarction, unspecified: Secondary | ICD-10-CM | POA: Diagnosis present

## 2013-02-20 HISTORY — DX: Personal history of other medical treatment: Z92.89

## 2013-02-20 LAB — TROPONIN I
Troponin I: <0.3 ng/mL (ref <0.30)
Troponin I: <0.3 ng/mL (ref <0.30)

## 2013-02-20 LAB — TSH: TSH: 1.648 u[IU]/mL (ref 0.350–4.500)

## 2013-02-20 MED ORDER — CLOPIDOGREL BISULFATE 75 MG PO TABS
75.0000 mg | ORAL_TABLET | Freq: Every day | ORAL | Status: DC
  Administered 2013-02-20 – 2013-02-22 (×3): 75 mg via ORAL
  Filled 2013-02-20 (×4): qty 1

## 2013-02-20 MED ORDER — SODIUM CHLORIDE 0.9 % IJ SOLN
3.0000 mL | Freq: Two times a day (BID) | INTRAMUSCULAR | Status: DC
  Administered 2013-02-21: 3 mL via INTRAVENOUS

## 2013-02-20 MED ORDER — LEVOTHYROXINE SODIUM 75 MCG PO TABS
75.0000 ug | ORAL_TABLET | Freq: Every day | ORAL | Status: DC
  Administered 2013-02-20 – 2013-02-22 (×3): 75 ug via ORAL
  Filled 2013-02-20 (×4): qty 1

## 2013-02-20 MED ORDER — SODIUM CHLORIDE 0.9 % IV SOLN
INTRAVENOUS | Status: DC
  Administered 2013-02-20: 03:00:00 via INTRAVENOUS
  Administered 2013-02-21: 75 mL/h via INTRAVENOUS

## 2013-02-20 MED ORDER — ASPIRIN 81 MG PO CHEW: 81.0000 mg | CHEWABLE_TABLET | Freq: Every morning | ORAL | Status: DC

## 2013-02-20 MED ORDER — CAPTOPRIL 100 MG PO TABS
100.0000 mg | ORAL_TABLET | Freq: Two times a day (BID) | ORAL | Status: DC
  Administered 2013-02-20 – 2013-02-22 (×5): 100 mg via ORAL
  Filled 2013-02-20 (×6): qty 1

## 2013-02-20 MED ORDER — METOPROLOL SUCCINATE ER 100 MG PO TB24
100.0000 mg | ORAL_TABLET | Freq: Every day | ORAL | Status: DC
  Administered 2013-02-20 – 2013-02-22 (×3): 100 mg via ORAL
  Filled 2013-02-20 (×3): qty 1

## 2013-02-20 MED ORDER — AMLODIPINE BESYLATE 2.5 MG PO TABS
2.5000 mg | ORAL_TABLET | Freq: Every day | ORAL | Status: DC
  Administered 2013-02-20 – 2013-02-21 (×2): 2.5 mg via ORAL
  Filled 2013-02-20 (×3): qty 1

## 2013-02-20 NOTE — H&P (Signed)
Hospitalist Admission History and Physical  Patient name: Grace Silva Medical record number: [id number] Date of birth: Nov 17, 1918 Age: 79 y.o. Gender: female  Primary Care Provider: Pamelia Hoit, MD  Chief Complaint: recurrent falls, encephalopathy  History of Present Illness:This is a 79 y.o. year old female with significant past medical history of HTN, blindness, hx/o pacemaker placement presenting with encephalopathy s/p fall. Pt had an unwitnessed fall at home earlier today. Pt was seen a [facility] for sxs. Workup included bloodwork and head/c spine CT as well as multiple [facility] xrays. Workup essentially normal apart from ? ? Subacute basal ganglia infarct on head CT as well as chronic microvascular changes. Per niece, Pt was returned back to baseline at [facility] and was discharged. When pt returned home with niece, pt became weakness and slowly fell to ground and became unresponsive again. Pt was then brought to ER for further eval. Per niece, pt lives at home with her sons who do not follow pt closely. Pt has had 2 unwitnessed falls over the last week. No recent infections. No reports of vomiting, fever, diarrhea.  Pt was due for pacemaker battery change today. Cardiologist [facility]. Dr. Deirdre Pippins.    There are no active problems to display for this patient.  Past Medical History: Past Medical History  Diagnosis Date  . Thyroid disease   . Legally blind   . Hypertension   . Pacemaker     Past Surgical History: Past Surgical History  Procedure Laterality Date  . Pacemaker placement      Social History: History   Social History  . Marital Status: Widowed    Spouse Name: N/A    Number of Children: N/A  . Years of Education: N/A   Social History Main Topics  . Smoking status: Never Smoker   . Smokeless tobacco: None  . Alcohol Use: No  . Drug Use: No  . Sexually Active: None   Other Topics Concern  . None   Social History Narrative  . None    Family History: History  reviewed. No pertinent family history.  Allergies: No Known Allergies  Current Facility-Administered Medications  Medication Dose Route Frequency Provider Last Rate Last Dose  . 0.9 %  sodium chloride infusion   Intravenous Continuous Doree Albee, MD      . amLODipine (NORVASC) tablet 2.5 mg  2.5 mg Oral Daily Doree Albee, MD      . aspirin chewable tablet 81 mg  81 mg Oral q morning - 10a Doree Albee, MD      . captopril (CAPOTEN) tablet 100 mg  100 mg Oral BID Doree Albee, MD      . levothyroxine (SYNTHROID, LEVOTHROID) tablet 75 mcg  75 mcg Oral QAC breakfast Doree Albee, MD      . metoprolol succinate (TOPROL-XL) 24 hr tablet 100 mg  100 mg Oral Daily Doree Albee, MD      . sodium chloride 0.9 % injection 3 mL  3 mL Intravenous Q12H Doree Albee, MD       Current Outpatient Prescriptions  Medication Sig Dispense Refill  . amLODipine (NORVASC) 2.5 MG tablet Take 2.5 mg by mouth daily.      Marland Kitchen aspirin 81 MG chewable tablet Chew 81 mg by mouth every morning.      . captopril (CAPOTEN) 100 MG tablet Take 100 mg by mouth 2 (two) times daily.      Marland Kitchen levothyroxine (SYNTHROID, LEVOTHROID) 75 MCG tablet Take 75 mcg by mouth daily before breakfast.      .  metoprolol succinate (TOPROL-XL) 100 MG 24 hr tablet Take 100 mg by mouth daily. Take with or immediately following a meal.       Facility-Administered Medications Ordered in Other Encounters  Medication Dose Route Frequency Provider Last Rate Last Dose  . ceFAZolin (ANCEF) IVPB 2 g/50 mL premix  2 g Intravenous On Call Thurmon Fair, MD       Review Of Systems: 12 point ROS negative except as noted above in HPI.  Physical Exam: Filed Vitals:   02/19/13 2254  BP: 154/85  Pulse: 65  Resp: 18    General: unresponsive, does withdraw to painful stimuli, cachectic  HEENT: pupils fixed bilaterally, baseline blindness , dry oral mucosa  Heart: S1, S2 normal, no murmur, rub or gallop, regular rate and rhythm Lungs: poor resp  effort, no auscultatable rales/wheezes Abdomen: abdomen is soft without significant tenderness, masses, organomegaly or guarding Extremities: extremities normal, atraumatic, no cyanosis or edema Skin:no rashes, no ecchymoses Neurology: unresponsive. Withdraw to painful stimuli   Labs and Imaging: Lab Results  Component Value Date/Time   NA 142 02/19/2013  4:23 PM   K 4.3 02/19/2013  4:23 PM   CL 103 02/19/2013  4:23 PM   CO2 31 02/19/2013  3:37 PM   BUN 14 02/19/2013  4:23 PM   CREATININE 0.90 02/19/2013  4:23 PM   CREATININE 0.98 02/16/2013  1:36 PM   GLUCOSE 95 02/19/2013  4:23 PM   Lab Results  Component Value Date   WBC 6.4 02/19/2013   HGB 16.0* 02/19/2013   HCT 47.0* 02/19/2013   MCV 91.0 02/19/2013   PLT 145* 02/19/2013    Dg Chest 2 View  02/19/2013   *RADIOLOGY REPORT*  Clinical Data: Fall.  Hypertension.  CHEST - 2 VIEW  Comparison: PA and lateral chest 10/22/2009.  Findings: Heart size is normal.  Lungs are clear.  No pneumothorax or pleural fluid.  Pacing device is noted.  Degenerative disease is seen about the shoulders.  IMPRESSION: No acute disease.   Original Report Authenticated By: Holley Dexter, M.D.   Dg Hip Bilateral Vito Berger  02/19/2013   *RADIOLOGY REPORT*  Clinical Data: Status post fall.  BILATERAL HIP WITH PELVIS - 4+ VIEW  Comparison: The  Findings: No acute bony or joint abnormality is identified.  Mild degenerative change is seen about the hips.  Lower lumbar spondylosis also noted.  IMPRESSION: No acute finding.   Original Report Authenticated By: Holley Dexter, M.D.   Ct Head Wo Contrast  02/19/2013   *RADIOLOGY REPORT*  Clinical Data:  Recurrent falls.  The patient was found down.  CT HEAD WITHOUT CONTRAST CT CERVICAL SPINE WITHOUT CONTRAST  Technique:  Multidetector CT imaging of the head and cervical spine was performed following the standard protocol without intravenous contrast.  Multiplanar CT image reconstructions of the cervical spine were also  generated.  Comparison:  Head CT scan 03/27/2008.  Head and cervical spine CT scan 09/28/2007.  CT HEAD  Findings: The brain is atrophic with extensive chronic microvascular ischemic change.  Large, remote right PCA territory infarct is identified.  Since the prior studies, the patient has developed a right basal ganglia infarct which appears subacute to remote.  No hemorrhage, midline shift or abnormal extra-axial fluid collection is identified.  The calvarium is intact.  IMPRESSION:  1.  Right basal ganglia infarct is new since the 2009 study and appears subacute to remote. 2.  Atrophy, extensive chronic microvascular ischemic change and remote, large right PCA territory infarct.  CT CERVICAL SPINE  Findings: No fracture or subluxation of the cervical spine is identified.  Multilevel degenerative disc disease and facet arthropathy are again seen and show some progression.   The lung apices are clear.  Atherosclerosis is noted.  IMPRESSION: No acute finding.  Multilevel degenerative change shows progression since the prior study.   Original Report Authenticated By: Holley Dexter, M.D.   Ct Cervical Spine Wo Contrast  02/19/2013   *RADIOLOGY REPORT*  Clinical Data:  Recurrent falls.  The patient was found down.  CT HEAD WITHOUT CONTRAST CT CERVICAL SPINE WITHOUT CONTRAST  Technique:  Multidetector CT imaging of the head and cervical spine was performed following the standard protocol without intravenous contrast.  Multiplanar CT image reconstructions of the cervical spine were also generated.  Comparison:  Head CT scan 03/27/2008.  Head and cervical spine CT scan 09/28/2007.  CT HEAD  Findings: The brain is atrophic with extensive chronic microvascular ischemic change.  Large, remote right PCA territory infarct is identified.  Since the prior studies, the patient has developed a right basal ganglia infarct which appears subacute to remote.  No hemorrhage, midline shift or abnormal extra-axial fluid collection  is identified.  The calvarium is intact.  IMPRESSION:  1.  Right basal ganglia infarct is new since the 2009 study and appears subacute to remote. 2.  Atrophy, extensive chronic microvascular ischemic change and remote, large right PCA territory infarct.  CT CERVICAL SPINE  Findings: No fracture or subluxation of the cervical spine is identified.  Multilevel degenerative disc disease and facet arthropathy are again seen and show some progression.   The lung apices are clear.  Atherosclerosis is noted.  IMPRESSION: No acute finding.  Multilevel degenerative change shows progression since the prior study.   Original Report Authenticated By: Holley Dexter, M.D.   Dg Knee Complete 4 Views Left  02/19/2013   *RADIOLOGY REPORT*  Clinical Data: Status post fall  LEFT KNEE - COMPLETE 4+ VIEW  Comparison: None.  Findings: There is no joint effusion.  Sharpening tibial spines and marginal spur formation is identified compatible with osteoarthritis.  No fractures or dislocations identified.  No radio-opaque foreign body or soft tissue calcification.  IMPRESSION:  1.  Osteoarthritis. 2.  No acute findings.   Original Report Authenticated By: Signa Kell, M.D.     Assessment and Plan: NESIAH JUMP is a 79 y.o. year old female presenting with falls and encephalopathy  Encephalopathy:  Suspect this is likely multifactorial with contributions from falls, likely baseline dementia, microvascular disease as well as dehydration. No signs of infection currently. Check CXR. Urine culture. Hydrate pt. Check 2d ECHO. MRI contraindicated given pacemaker. Check ammonia level. Follow clinically.   HTN: labile pressures since being in ER, but now normalizing. Continue home regimen. Continue to follow.   FEN/GI: NPO.  Prophylaxis: hold anticoagulation given recent falls. SCDs.  Disposition: pending further evaluation. Family will discuss overall plan for pt.  Code Status:full code.         Doree Albee MD   Pager: [phone]

## 2013-02-20 NOTE — Progress Notes (Addendum)
Pacemaker check shows normal lead parameters and no change in battery status (ERI, but anticipate 6 more weeks of reliable service). No recent arrhythmia to explain falls.

## 2013-02-20 NOTE — Progress Notes (Signed)
[facility]       CONSULTATION NOTE   Reason for Consult: Pacemaker at [facility]  Requesting Physician: Thedore Mins  Cardiologist: Alanda Amass  HPI: Mrs. Narasimhan was scheduled for elective pacemaker generator changeout today (normally functioning device with generator at elective replacement indicator), but was admitted for falls and confusion. At her recent check she was pacemaker dependent (100% paced rhythm, no escape at 30 bpm) now, but historically only V paces 16% of the time (93% atrial paced).  Will interrogate device, but by recent check and telemetry and ECG, device function appears to be normal except for battery voltage. Generator changeout will be delayed until current workup is complete.  She has a history of SSS with remote frank syncope, documented bradycardia, and multiple falls in 2009. She has a history of hypertension, hypothyroidism on therapy, and hyperlipidemia. She is not suspected of having coronary disease and has not required catheterization. She had a negative Myoview for ischemia in 2009. Echocardiogram showed normal EF with no significant valve disease, normal RVSP.  She tripped ERI on February 04, 2013, and reverted to VVIR mode at 65 per minute. Reprogrammed DDDR in the office.  RA threshold was 0.5 at 0.4, atrial impedance is 480, P waves measured at 1.6 mv. She was pacer dependent at 30 on the RV lead and RV threshold 1.0 at 0.4, impedance 552 ohms. She had rapid battery depletion, seen with her Medtronic EnRhythm device.   PMHx:  Past Medical History  Diagnosis Date  . Thyroid disease   . Legally blind   . Hypertension   . Pacemaker    Past Surgical History  Procedure Laterality Date  . Pacemaker placement      FAMHx: History reviewed. No pertinent family history.  SOCHx:  reports that she has never smoked. She does not have any smokeless tobacco history on file. She reports that she does not drink alcohol or use illicit  drugs.  ALLERGIES: No Known Allergies  ROS: very disoriented, but denies chest pain. dyspnea, palpitations, fever, chills, abdominal or flank pain, dysuria.  HOME MEDICATIONS: Prescriptions prior to admission  Medication Sig Dispense Refill  . amLODipine (NORVASC) 2.5 MG tablet Take 2.5 mg by mouth daily.      Marland Kitchen aspirin 81 MG chewable tablet Chew 81 mg by mouth every morning.      . captopril (CAPOTEN) 100 MG tablet Take 100 mg by mouth 2 (two) times daily.      Marland Kitchen levothyroxine (SYNTHROID, LEVOTHROID) 75 MCG tablet Take 75 mcg by mouth daily before breakfast.      . metoprolol succinate (TOPROL-XL) 100 MG 24 hr tablet Take 100 mg by mouth daily. Take with or immediately following a meal.        HOSPITAL MEDICATIONS: I have reviewed the patient's current medications. Prior to Admission:  Prescriptions prior to admission  Medication Sig Dispense Refill  . amLODipine (NORVASC) 2.5 MG tablet Take 2.5 mg by mouth daily.      Marland Kitchen aspirin 81 MG chewable tablet Chew 81 mg by mouth every morning.      . captopril (CAPOTEN) 100 MG tablet Take 100 mg by mouth 2 (two) times daily.      Marland Kitchen levothyroxine (SYNTHROID, LEVOTHROID) 75 MCG tablet Take 75 mcg by mouth daily before breakfast.      . metoprolol succinate (TOPROL-XL) 100 MG 24 hr tablet Take 100 mg by mouth daily. Take with or immediately following a meal.  VITALS: Blood pressure 164/82, pulse 60, temperature 97.9 F (36.6 C), temperature source Oral, resp. rate 19, weight 120 lb 5.9 oz (54.6 kg), SpO2 96.00%.  PHYSICAL EXAM: General appearance: alert and disoriented to time, place and even that she is in the hospital Neck: no adenopathy, no carotid bruit, no JVD, supple, symmetrical, trachea midline and thyroid not enlarged, symmetric, no tenderness/mass/nodules Lungs: clear to auscultation bilaterally Heart: regular rate and rhythm, S1: normal, S2: paradoxically splitting, no S3 or S4 and no rub Abdomen: soft, non-tender; bowel  sounds normal; no masses,  no organomegaly Extremities: extremities normal, atraumatic, no cyanosis or edema Pulses: 2+ and symmetric Skin: Skin color, texture, turgor normal. No rashes or lesions Neurologic:  alertness: lethargic, orientation: self only  LABS: Results for orders placed during the hospital encounter of 02/19/13 (from the past 48 hour(s))  PRO B NATRIURETIC PEPTIDE     Status: Abnormal   Collection Time    02/20/13  5:20 AM      Result Value Range   Pro B Natriuretic peptide (BNP) 2450.0 (*) 0 - 450 pg/mL  TROPONIN I     Status: None   Collection Time    02/20/13  5:20 AM      Result Value Range   Troponin I <0.30  <0.30 ng/mL   Comment:            Due to the release kinetics of cTnI,     a negative result within the first hours     of the onset of symptoms does not rule out     myocardial infarction with certainty.     If myocardial infarction is still suspected,     repeat the test at appropriate intervals.  AMMONIA     Status: None   Collection Time    02/20/13  5:20 AM      Result Value Range   Ammonia 29  11 - 60 umol/L    IMAGING: Dg Chest 2 View  02/19/2013   *RADIOLOGY REPORT*  Clinical Data: Fall.  Hypertension.  CHEST - 2 VIEW  Comparison: PA and lateral chest 10/22/2009.  Findings: Heart size is normal.  Lungs are clear.  No pneumothorax or pleural fluid.  Pacing device is noted.  Degenerative disease is seen about the shoulders.  IMPRESSION: No acute disease.   Original Report Authenticated By: Holley Dexter, M.D.   Dg Hip Bilateral Vito Berger  02/19/2013   *RADIOLOGY REPORT*  Clinical Data: Status post fall.  BILATERAL HIP WITH PELVIS - 4+ VIEW  Comparison: The  Findings: No acute bony or joint abnormality is identified.  Mild degenerative change is seen about the hips.  Lower lumbar spondylosis also noted.  IMPRESSION: No acute finding.   Original Report Authenticated By: Holley Dexter, M.D.   Ct Head Wo Contrast  02/19/2013   *RADIOLOGY  REPORT*  Clinical Data:  Recurrent falls.  The patient was found down.  CT HEAD WITHOUT CONTRAST CT CERVICAL SPINE WITHOUT CONTRAST  Technique:  Multidetector CT imaging of the head and cervical spine was performed following the standard protocol without intravenous contrast.  Multiplanar CT image reconstructions of the cervical spine were also generated.  Comparison:  Head CT scan 03/27/2008.  Head and cervical spine CT scan 09/28/2007.  CT HEAD  Findings: The brain is atrophic with extensive chronic microvascular ischemic change.  Large, remote right PCA territory infarct is identified.  Since the prior studies, the patient has developed a right basal ganglia infarct which appears subacute to  remote.  No hemorrhage, midline shift or abnormal extra-axial fluid collection is identified.  The calvarium is intact.  IMPRESSION:  1.  Right basal ganglia infarct is new since the 2009 study and appears subacute to remote. 2.  Atrophy, extensive chronic microvascular ischemic change and remote, large right PCA territory infarct.  CT CERVICAL SPINE  Findings: No fracture or subluxation of the cervical spine is identified.  Multilevel degenerative disc disease and facet arthropathy are again seen and show some progression.   The lung apices are clear.  Atherosclerosis is noted.  IMPRESSION: No acute finding.  Multilevel degenerative change shows progression since the prior study.   Original Report Authenticated By: Holley Dexter, M.D.   Ct Cervical Spine Wo Contrast  02/19/2013   *RADIOLOGY REPORT*  Clinical Data:  Recurrent falls.  The patient was found down.  CT HEAD WITHOUT CONTRAST CT CERVICAL SPINE WITHOUT CONTRAST  Technique:  Multidetector CT imaging of the head and cervical spine was performed following the standard protocol without intravenous contrast.  Multiplanar CT image reconstructions of the cervical spine were also generated.  Comparison:  Head CT scan 03/27/2008.  Head and cervical spine CT scan  09/28/2007.  CT HEAD  Findings: The brain is atrophic with extensive chronic microvascular ischemic change.  Large, remote right PCA territory infarct is identified.  Since the prior studies, the patient has developed a right basal ganglia infarct which appears subacute to remote.  No hemorrhage, midline shift or abnormal extra-axial fluid collection is identified.  The calvarium is intact.  IMPRESSION:  1.  Right basal ganglia infarct is new since the 2009 study and appears subacute to remote. 2.  Atrophy, extensive chronic microvascular ischemic change and remote, large right PCA territory infarct.  CT CERVICAL SPINE  Findings: No fracture or subluxation of the cervical spine is identified.  Multilevel degenerative disc disease and facet arthropathy are again seen and show some progression.   The lung apices are clear.  Atherosclerosis is noted.  IMPRESSION: No acute finding.  Multilevel degenerative change shows progression since the prior study.   Original Report Authenticated By: Holley Dexter, M.D.   Dg Knee Complete 4 Views Left  02/19/2013   *RADIOLOGY REPORT*  Clinical Data: Status post fall  LEFT KNEE - COMPLETE 4+ VIEW  Comparison: None.  Findings: There is no joint effusion.  Sharpening tibial spines and marginal spur formation is identified compatible with osteoarthritis.  No fractures or dislocations identified.  No radio-opaque foreign body or soft tissue calcification.  IMPRESSION:  1.  Osteoarthritis. 2.  No acute findings.   Original Report Authenticated By: Signa Kell, M.D.    IMPRESSION: 1. Delirium versus dementia 2. Pacemaker at ERI, but not emergently necessary to change out 3. Elevated BNP is nonspecific in a nonagenarian, no other findings support CHF  RECOMMENDATION: Will interrogate device, but by recent check and telemetry and ECG, device function appears to be normal. Generator changeout will be delayed until current workup is complete. She is pacemaker dependent and  would prefer to change generator on this admission if safe. I fe necessary though, generator changeout can be delayed for up to approximately 6 weeks.  Time Spent Directly with Patient: 45 minutes  Thurmon Fair, MD, [facility] [phone] office [phone] pager  02/20/2013, 8:40 AM

## 2013-02-20 NOTE — ED Provider Notes (Signed)
History    CSN: [id number] Arrival date & time 02/19/13  2251  First MD Initiated Contact with Patient 02/20/13 0012     Chief Complaint  Patient presents with  . Fatigue  . Fall   (Consider location/radiation/quality/duration/timing/severity/associated sxs/prior Treatment) Patient is a 79 y.o. female presenting with fall. The history is provided by a relative.  Fall  She had a fall at home earlier today and had been taken to [facility] where workup was unremarkable and she was discharged. Family member states that on the way home, she was confused and tried to open the [place] was stopped at a traffic light. Then, on arriving home, she could stand but would not move her legs to walk. She was confused and that she did not recognize her daughter and she was incontinent of urine. All of these are new for her. The daughter noted that she seemed to collapse when standing and the patient stated this is what had happened to her earlier in the day. Also, of note, she is scheduled to have a pacemaker battery change tomorrow. Past Medical History  Diagnosis Date  . Thyroid disease   . Legally blind   . Hypertension   . Pacemaker    Past Surgical History  Procedure Laterality Date  . Pacemaker placement     History reviewed. No pertinent family history. History  Substance Use Topics  . Smoking status: Never Smoker   . Smokeless tobacco: Not on file  . Alcohol Use: No   OB History   Grav Para Term Preterm Abortions TAB SAB Ect Mult Living                 Review of Systems  Unable to perform ROS: Mental status change    Allergies  Review of patient's allergies indicates no known allergies.  Home Medications   Current Outpatient Rx  Name  Route  Sig  Dispense  Refill  . amLODipine (NORVASC) 2.5 MG tablet   Oral   Take 2.5 mg by mouth daily.         Marland Kitchen aspirin 81 MG chewable tablet   Oral   Chew 81 mg by mouth every morning.         . captopril (CAPOTEN)  100 MG tablet   Oral   Take 100 mg by mouth 2 (two) times daily.         Marland Kitchen levothyroxine (SYNTHROID, LEVOTHROID) 75 MCG tablet   Oral   Take 75 mcg by mouth daily before breakfast.         . metoprolol succinate (TOPROL-XL) 100 MG 24 hr tablet   Oral   Take 100 mg by mouth daily. Take with or immediately following a meal.          BP 154/85  Pulse 65  Resp 18  SpO2 97% Physical Exam  Nursing note and vitals reviewed.  79 year old female, resting comfortably and in no acute distress. Vital signs are significant for hypertension with blood pressure 154/85. Oxygen saturation is 97%, which is normal. Head is normocephalic and atraumatic. Right pupil is 4 mm, left pupil 6 mm, EOMI. Oropharynx is clear. Family states that anisocoria is chronic and that she is blind from macular degeneration. Neck is nontender and supple without adenopathy or JVD. There no carotid bruits. Back is nontender and there is no CVA tenderness. Lungs are clear without rales, wheezes, or rhonchi. Chest is nontender. Heart has regular rate and rhythm with 2/6 systolic  ejection murmur. Abdomen is soft, flat, nontender without masses or hepatosplenomegaly and peristalsis is normoactive. Extremities have no cyanosis or edema, full range of motion is present. Skin is warm and dry without rash. Neurologic: She is somnolent but is arousable to pain. She does not answer questions or follow commands. Cranial nerves are grossly intact except for anisocoria, there are no gross motor or sensory deficits.  ED Course  Procedures (including critical care time) Labs Reviewed - No data to display Dg Chest 2 View  02/19/2013   *RADIOLOGY REPORT*  Clinical Data: Fall.  Hypertension.  CHEST - 2 VIEW  Comparison: PA and lateral chest 10/22/2009.  Findings: Heart size is normal.  Lungs are clear.  No pneumothorax or pleural fluid.  Pacing device is noted.  Degenerative disease is seen about the shoulders.  IMPRESSION: No acute  disease.   Original Report Authenticated By: Holley Dexter, M.D.   Dg Hip Bilateral Vito Berger  02/19/2013   *RADIOLOGY REPORT*  Clinical Data: Status post fall.  BILATERAL HIP WITH PELVIS - 4+ VIEW  Comparison: The  Findings: No acute bony or joint abnormality is identified.  Mild degenerative change is seen about the hips.  Lower lumbar spondylosis also noted.  IMPRESSION: No acute finding.   Original Report Authenticated By: Holley Dexter, M.D.   Ct Head Wo Contrast  02/19/2013   *RADIOLOGY REPORT*  Clinical Data:  Recurrent falls.  The patient was found down.  CT HEAD WITHOUT CONTRAST CT CERVICAL SPINE WITHOUT CONTRAST  Technique:  Multidetector CT imaging of the head and cervical spine was performed following the standard protocol without intravenous contrast.  Multiplanar CT image reconstructions of the cervical spine were also generated.  Comparison:  Head CT scan 03/27/2008.  Head and cervical spine CT scan 09/28/2007.  CT HEAD  Findings: The brain is atrophic with extensive chronic microvascular ischemic change.  Large, remote right PCA territory infarct is identified.  Since the prior studies, the patient has developed a right basal ganglia infarct which appears subacute to remote.  No hemorrhage, midline shift or abnormal extra-axial fluid collection is identified.  The calvarium is intact.  IMPRESSION:  1.  Right basal ganglia infarct is new since the 2009 study and appears subacute to remote. 2.  Atrophy, extensive chronic microvascular ischemic change and remote, large right PCA territory infarct.  CT CERVICAL SPINE  Findings: No fracture or subluxation of the cervical spine is identified.  Multilevel degenerative disc disease and facet arthropathy are again seen and show some progression.   The lung apices are clear.  Atherosclerosis is noted.  IMPRESSION: No acute finding.  Multilevel degenerative change shows progression since the prior study.   Original Report Authenticated By: Holley Dexter, M.D.   Ct Cervical Spine Wo Contrast  02/19/2013   *RADIOLOGY REPORT*  Clinical Data:  Recurrent falls.  The patient was found down.  CT HEAD WITHOUT CONTRAST CT CERVICAL SPINE WITHOUT CONTRAST  Technique:  Multidetector CT imaging of the head and cervical spine was performed following the standard protocol without intravenous contrast.  Multiplanar CT image reconstructions of the cervical spine were also generated.  Comparison:  Head CT scan 03/27/2008.  Head and cervical spine CT scan 09/28/2007.  CT HEAD  Findings: The brain is atrophic with extensive chronic microvascular ischemic change.  Large, remote right PCA territory infarct is identified.  Since the prior studies, the patient has developed a right basal ganglia infarct which appears subacute to remote.  No hemorrhage, midline shift or abnormal  extra-axial fluid collection is identified.  The calvarium is intact.  IMPRESSION:  1.  Right basal ganglia infarct is new since the 2009 study and appears subacute to remote. 2.  Atrophy, extensive chronic microvascular ischemic change and remote, large right PCA territory infarct.  CT CERVICAL SPINE  Findings: No fracture or subluxation of the cervical spine is identified.  Multilevel degenerative disc disease and facet arthropathy are again seen and show some progression.   The lung apices are clear.  Atherosclerosis is noted.  IMPRESSION: No acute finding.  Multilevel degenerative change shows progression since the prior study.   Original Report Authenticated By: Holley Dexter, M.D.   Dg Knee Complete 4 Views Left  02/19/2013   *RADIOLOGY REPORT*  Clinical Data: Status post fall  LEFT KNEE - COMPLETE 4+ VIEW  Comparison: None.  Findings: There is no joint effusion.  Sharpening tibial spines and marginal spur formation is identified compatible with osteoarthritis.  No fractures or dislocations identified.  No radio-opaque foreign body or soft tissue calcification.  IMPRESSION:  1.   Osteoarthritis. 2.  No acute findings.   Original Report Authenticated By: Signa Kell, M.D.   1. Altered mental status     MDM  Altered mental status. I reviewed her emergency room record from her visit earlier today and extensive workup was unremarkable including serum CK which is only mildly elevated. I do not see any indication for repeating any of her testing at this point. She will need to be admitted for evaluation of her altered mentation., CT scan earlier today showed old strokes which family was unaware of. Case is discussed with Dr. Alvester Morin of [facility] who agrees to come and evaluate the patient.  Dione Booze, MD 02/20/13 [phone]

## 2013-02-20 NOTE — Progress Notes (Signed)
  Echocardiogram 2D Echocardiogram has been performed.  Cathie Beams 02/20/2013, 12:43 PM

## 2013-02-20 NOTE — Progress Notes (Signed)
[facility]                                                                                Patient Demographics  Oneka Silva, is a 79 y.o. female, DOB - 1919-01-02, ZOX:[id number], WJX:[id number]  Admit date - 02/19/2013  Admitting Physician Doree Albee, MD  Outpatient Primary MD for the patient is Pamelia Hoit, MD  LOS - 1   Chief Complaint  Patient presents with  . Fatigue  . Fall        Assessment & Plan    1. Acute encephalopathy in a patient with likely subacute CVA about one week ago per history obtained from family. Continue supportive care, she was on aspirin and I will switch her to Plavix, CT scan of the head shows an acute CVA, she has a pacemaker therefore no MRI, we'll check carotid ultrasound along with echogram, check lipid panel and A1c. PT and speech to evaluate. Have discussed the plan with her son Tomma Lightning over the phone in detail and her niece plan is gentle medical treatment, placement if needed. No heroics or surgeries. DO NOT RESUSCITATE.  Lab Results  Component Value Date   HGBA1C  Value: 5.7 (NOTE)   The ADA recommends the following therapeutic goals for glycemic   control related to Hgb A1C measurement:   Goal of Therapy:   < 7.0% Hgb A1C   Action Suggested:  > 8.0% Hgb A1C   Ref:  Diabetes Care, 22, Suppl. 1, 1999 07/08/2007    Lab Results  Component Value Date   CHOL  Value: 179        ATP III CLASSIFICATION:  <200     mg/dL   Desirable  213-086  mg/dL   Borderline High  >=578    mg/dL   High 46/04/6294   HDL 26* 07/08/2007   LDLCALC  Value: 123        Total Cholesterol/HDL:CHD Risk Coronary Heart Disease Risk Table                     Men   Women  1/2 Average Risk   3.4   3.3* 07/08/2007   TRIG 151* 07/08/2007   CHOLHDL 6.9 07/08/2007    2. Hypertension continue present medications. She is on beta blocker, Norvasc and captopril.   3. Sick sinus syndrome. Status post pacemaker, pacemaker stable. Has been seen by cardiology appreciate  their input.    4. Legally blind. Supportive care.    5. Hypothyroidism check TSH continue home dose Synthroid.     Code Status: DO NOT RESUSCITATE  Family Communication: Discussed with her niece and son Tomma Lightning over the phone, they both want medical treatment, no surgeries, DO NOT RESUSCITATE. They're agreeable for placement if needed the  Disposition Plan: Likely SNF   Procedures CT scan of the head, echogram, carotid ultrasound   Consults  PT, speech, cardiology   DVT Prophylaxis   SCDs    Lab Results  Component Value Date   PLT 145* 02/19/2013    Medications  Scheduled Meds: . amLODipine  2.5 mg Oral Daily  . captopril  100 mg Oral BID  . clopidogrel  75 mg  Oral Q breakfast  . levothyroxine  75 mcg Oral QAC breakfast  . metoprolol succinate  100 mg Oral Daily  . sodium chloride  3 mL Intravenous Q12H   Continuous Infusions: . sodium chloride 75 mL/hr at 02/20/13 0245   PRN Meds:.  Antibiotics     Anti-infectives   None       Time Spent in minutes  35   Ersel Wadleigh K M.D on 02/20/2013 at 10:41 AM  Between 7am to 7pm - Pager - [phone]  After 7pm go to [email or web address] - password [email or web address]  And look for the night coverage person covering for me after hours  [facility] Office  [phone]    Subjective:   Grace Silva today has, No headache, No chest pain, No abdominal pain - No Nausea, No new weakness tingling or numbness, No Cough - SOB.   Objective:   Filed Vitals:   02/20/13 0300 02/20/13 0330 02/20/13 0414 02/20/13 0653  BP: 167/82 165/88 184/80 164/82  Pulse: 60 60 62 60  Temp:   97.9 F (36.6 C)   TempSrc:   Oral   Resp: 17 19 20 19   Weight:   54.6 kg (120 lb 5.9 oz)   SpO2: 96% 96% 95% 96%    Wt Readings from Last 3 Encounters:  02/20/13 54.6 kg (120 lb 5.9 oz)    No intake or output data in the 24 hours ending 02/20/13 1041  Exam Awake but pleasantly confused, No new F.N deficits, Normal  affect [place].AT,PERRAL Supple Neck,No JVD, No cervical lymphadenopathy appriciated.  Symmetrical Chest wall movement, Good air movement bilaterally, CTAB RRR,No Gallops,Rubs or new Murmurs, No Parasternal Heave +ve B.Sounds, Abd Soft, Non tender, No organomegaly appriciated, No rebound - guarding or rigidity. No Cyanosis, Clubbing or edema, No new Rash or bruise     Data Review   Micro Results No results found for this or any previous visit (from the past 240 hour(s)).  Radiology Reports Dg Chest 2 View  02/19/2013   *RADIOLOGY REPORT*  Clinical Data: Fall.  Hypertension.  CHEST - 2 VIEW  Comparison: PA and lateral chest 10/22/2009.  Findings: Heart size is normal.  Lungs are clear.  No pneumothorax or pleural fluid.  Pacing device is noted.  Degenerative disease is seen about the shoulders.  IMPRESSION: No acute disease.   Original Report Authenticated By: Holley Dexter, M.D.   Dg Hip Bilateral Vito Berger  02/19/2013   *RADIOLOGY REPORT*  Clinical Data: Status post fall.  BILATERAL HIP WITH PELVIS - 4+ VIEW  Comparison: The  Findings: No acute bony or joint abnormality is identified.  Mild degenerative change is seen about the hips.  Lower lumbar spondylosis also noted.  IMPRESSION: No acute finding.   Original Report Authenticated By: Holley Dexter, M.D.   Ct Head Wo Contrast  02/19/2013   *RADIOLOGY REPORT*  Clinical Data:  Recurrent falls.  The patient was found down.  CT HEAD WITHOUT CONTRAST CT CERVICAL SPINE WITHOUT CONTRAST  Technique:  Multidetector CT imaging of the head and cervical spine was performed following the standard protocol without intravenous contrast.  Multiplanar CT image reconstructions of the cervical spine were also generated.  Comparison:  Head CT scan 03/27/2008.  Head and cervical spine CT scan 09/28/2007.  CT HEAD  Findings: The brain is atrophic with extensive chronic microvascular ischemic change.  Large, remote right PCA territory infarct is identified.  Since  the prior studies, the patient has developed a right basal ganglia infarct which appears subacute  to remote.  No hemorrhage, midline shift or abnormal extra-axial fluid collection is identified.  The calvarium is intact.  IMPRESSION:  1.  Right basal ganglia infarct is new since the 2009 study and appears subacute to remote. 2.  Atrophy, extensive chronic microvascular ischemic change and remote, large right PCA territory infarct.  CT CERVICAL SPINE  Findings: No fracture or subluxation of the cervical spine is identified.  Multilevel degenerative disc disease and facet arthropathy are again seen and show some progression.   The lung apices are clear.  Atherosclerosis is noted.  IMPRESSION: No acute finding.  Multilevel degenerative change shows progression since the prior study.   Original Report Authenticated By: Holley Dexter, M.D.   Ct Cervical Spine Wo Contrast  02/19/2013   *RADIOLOGY REPORT*  Clinical Data:  Recurrent falls.  The patient was found down.  CT HEAD WITHOUT CONTRAST CT CERVICAL SPINE WITHOUT CONTRAST  Technique:  Multidetector CT imaging of the head and cervical spine was performed following the standard protocol without intravenous contrast.  Multiplanar CT image reconstructions of the cervical spine were also generated.  Comparison:  Head CT scan 03/27/2008.  Head and cervical spine CT scan 09/28/2007.  CT HEAD  Findings: The brain is atrophic with extensive chronic microvascular ischemic change.  Large, remote right PCA territory infarct is identified.  Since the prior studies, the patient has developed a right basal ganglia infarct which appears subacute to remote.  No hemorrhage, midline shift or abnormal extra-axial fluid collection is identified.  The calvarium is intact.  IMPRESSION:  1.  Right basal ganglia infarct is new since the 2009 study and appears subacute to remote. 2.  Atrophy, extensive chronic microvascular ischemic change and remote, large right PCA territory infarct.   CT CERVICAL SPINE  Findings: No fracture or subluxation of the cervical spine is identified.  Multilevel degenerative disc disease and facet arthropathy are again seen and show some progression.   The lung apices are clear.  Atherosclerosis is noted.  IMPRESSION: No acute finding.  Multilevel degenerative change shows progression since the prior study.   Original Report Authenticated By: Holley Dexter, M.D.   Dg Knee Complete 4 Views Left  02/19/2013   *RADIOLOGY REPORT*  Clinical Data: Status post fall  LEFT KNEE - COMPLETE 4+ VIEW  Comparison: None.  Findings: There is no joint effusion.  Sharpening tibial spines and marginal spur formation is identified compatible with osteoarthritis.  No fractures or dislocations identified.  No radio-opaque foreign body or soft tissue calcification.  IMPRESSION:  1.  Osteoarthritis. 2.  No acute findings.   Original Report Authenticated By: Signa Kell, M.D.    [facility]  Recent Labs Lab 02/16/13 1336 02/19/13 1537 02/19/13 1623  WBC 6.1 6.4  --   HGB 14.8 15.2* 16.0*  HCT 43.5 45.6 47.0*  PLT 153 145*  --   MCV 87.7 91.0  --   MCH 29.8 30.3  --   MCHC 34.0 33.3  --   RDW 15.4 14.4  --   LYMPHSABS 1.7 1.4  --   MONOABS 0.5 0.6  --   EOSABS 0.1 0.1  --   BASOSABS 0.1 0.1  --     Chemistries   Recent Labs Lab 02/16/13 1336 02/19/13 1537 02/19/13 1623  NA 142 141 142  K 3.4* 3.6 4.3  CL 102 101 103  CO2 26 31  --   GLUCOSE 144* 97 95  BUN 15 11 14   CREATININE 0.98 0.78 0.90  CALCIUM 8.7 9.1  --  AST 20 21  --   ALT 9 9  --   ALKPHOS 59 68  --   BILITOT 0.5 0.5  --    ------------------------------------------------------------------------------------------------------------------ CrCl is unknown because there is no height on file for the current visit. ------------------------------------------------------------------------------------------------------------------ No results found for this basename: HGBA1C,  in the last 72  hours ------------------------------------------------------------------------------------------------------------------ No results found for this basename: CHOL, HDL, LDLCALC, TRIG, CHOLHDL, LDLDIRECT,  in the last 72 hours ------------------------------------------------------------------------------------------------------------------ No results found for this basename: TSH, T4TOTAL, FREET3, T3FREE, THYROIDAB,  in the last 72 hours ------------------------------------------------------------------------------------------------------------------ No results found for this basename: VITAMINB12, FOLATE, FERRITIN, TIBC, IRON, RETICCTPCT,  in the last 72 hours  Coagulation profile  Recent Labs Lab 02/16/13 1336  INR 0.97    No results found for this basename: DDIMER,  in the last 72 hours  Cardiac Enzymes  Recent Labs Lab 02/20/13 0520  TROPONINI <0.30   ------------------------------------------------------------------------------------------------------------------ No components found with this basename: POCBNP,

## 2013-02-20 NOTE — Progress Notes (Signed)
Brief Nutrition Note:  RD pulled to chart for malnutrition screening tool. Pt states she does not know if she has lost any weight. Appetite has been WNL. RD observed lunch tray, ~100% complete.  Wt Readings from Last 5 Encounters:  02/20/13 120 lb 5.9 oz (54.6 kg)  BMI: 24 kg/(m^2) Diet: Dysphagia 3  Chart reviewed, no nutrition interventions warranted at this time. Please consult as needed.   Clarene Duke RD, LDN Pager [phone] After Hours pager [phone]

## 2013-02-20 NOTE — Evaluation (Signed)
Clinical/Bedside Swallow Evaluation Patient Details  Name: Grace Silva MRN: [id number] Date of Birth: [birth year]  Today's Date: 02/20/2013 Time: 6213-0865 SLP Time Calculation (min): 15 min  Past Medical History:  Past Medical History  Diagnosis Date  . Thyroid disease   . Legally blind   . Hypertension   . Pacemaker    Past Surgical History:  Past Surgical History  Procedure Laterality Date  . Pacemaker placement     HPI:  79 y.o. year old female with significant past medical history of HTN, blindness, hx/o pacemaker placement presenting with encephalopathy s/p fall. Pt had an unwitnessed fall at home earlier today.  CT showed right basal ganglia infarct is new since the 2009 study and appears subacute to remote.  CXR No acute abnormality noted. Per niece, Pt was returned back to baseline at [facility] and was discharged. When pt returned home with niece, pt became weakness and slowly fell to ground and became unresponsive again. Pt was then brought to ER for further eval. Pt was due for pacemaker battery change today. Cardiologist [facility]. Dr. Deirdre Pippins.    Assessment / Plan / Recommendation Clinical Impression  Pt. with mild delay in mastication of semi soft tomato without dentures in (states she always donns dentures before eating).  No overt s/s aspiration with thin (straw)  or solid.  Recommend diet texture downgrade to Dys 3 and continue thin until/if dentures are brought to hospital.  ST will follow x 1.     Aspiration Risk  Moderate    Diet Recommendation Dysphagia 3 (Mechanical Soft);Thin liquid   Liquid Administration via: Cup;Straw Medication Administration: Whole meds with liquid Supervision: Patient able to self feed;Intermittent supervision to cue for compensatory strategies Compensations: Slow rate;Small sips/bites Postural Changes and/or Swallow Maneuvers: Seated upright 90 degrees    Other  Recommendations Oral Care Recommendations: Oral care BID   Follow Up  Recommendations  None    Frequency and Duration min 1 x/week  1 week   Pertinent Vitals/Pain none    SLP Swallow Goals Patient will utilize recommended strategies during swallow to increase swallowing safety with: Minimal cueing   Swallow Study         Oral/Motor/Sensory Function Overall Oral Motor/Sensory Function: Appears within functional limits for tasks assessed   Ice Chips Ice chips: Not tested   Thin Liquid Thin Liquid: Within functional limits Presentation: Straw    Nectar Thick Nectar Thick Liquid: Not tested   Honey Thick Honey Thick Liquid: Not tested   Puree Puree: Within functional limits   Solid   GO    Solid: Impaired Oral Phase Impairments:  (delayed transit)       Royce Macadamia M.Ed [employer] [phone]  02/20/2013

## 2013-02-20 NOTE — Evaluation (Signed)
Physical Therapy Evaluation Patient Details Name: Grace Silva MRN: [id number] DOB: 1918-08-18 Today's Date: 02/20/2013 Time: 4098-1191 PT Time Calculation (min): 25 min  PT Assessment / Plan / Recommendation History of Present Illness  79 y.o. year old female with significant past medical history of HTN, blindness, hx/o pacemaker placement presenting with encephalopathy s/p fall.  Clinical Impression  Patient demonstrates some deficits in functional mobility as indicated below. Pt will benefit from continued skilled PT to address deficits and maximize function. Will continue to see as indicated. Rec HHPT upon discharge.    PT Assessment  Patient needs continued PT services    Follow Up Recommendations  Home health PT;Supervision/Assistance - 24 hour          Equipment Recommendations  None recommended by PT    Recommendations for Other Services OT consult   Frequency Min 3X/week    Precautions / Restrictions Precautions Precautions: Fall Restrictions Weight Bearing Restrictions: No (bedrest )   Pertinent Vitals/Pain No pain at this time      Mobility  Bed Mobility Bed Mobility: Rolling Right;Right Sidelying to Sit;Sitting - Scoot to Edge of Bed Rolling Right: 5: Supervision Right Sidelying to Sit: 5: Supervision Sitting - Scoot to Edge of Bed: 5: Supervision Details for Bed Mobility Assistance: No physical assist required, increased time to perform Transfers Transfers: Sit to Stand;Stand to Sit Sit to Stand: 4: Min guard Stand to Sit: 4: Min guard Details for Transfer Assistance: From various surfaces including bed, toilet, and chair Ambulation/Gait Ambulation/Gait Assistance: 4: Min assist Ambulation Distance (Feet): 50 Feet Assistive device: Rolling walker Ambulation/Gait Assistance Details: assist for control of rw (too big for patient) Gait Pattern: Step-through pattern;Decreased stride length;Shuffle;Trunk flexed;Narrow base of support Gait velocity:  decreased General Gait Details: some instability noted, question if related to oversized walker Stairs: No    Exercises General Exercises - Lower Extremity Ankle Circles/Pumps: AROM;Both;10 reps Long Arc Quad: AROM;Both;10 reps Hip Flexion/Marching: AROM;Both;10 reps   PT Diagnosis: Difficulty walking;Generalized weakness  PT Problem List: Decreased strength;Decreased activity tolerance;Decreased balance;Decreased mobility;Decreased coordination;Decreased knowledge of use of DME PT Treatment Interventions: DME instruction;Gait training;Stair training;Functional mobility training;Therapeutic activities;Therapeutic exercise;Balance training;Patient/family education     PT Goals(Current goals can be found in the care plan section) Acute Rehab PT Goals Patient Stated Goal: to go home PT Goal Formulation: With patient Time For Goal Achievement: 03/06/13 Potential to Achieve Goals: Good  Visit Information  Last PT Received On: 02/20/13 Assistance Needed: +1 History of Present Illness: 79 y.o. year old female with significant past medical history of HTN, blindness, hx/o pacemaker placement presenting with encephalopathy s/p fall.       Prior Functioning  Home Living Family/patient expects to be discharged to:: Private residence Living Arrangements: Children Available Help at Discharge: Family Type of Home: House Home Access: Stairs to enter Secretary/administrator of Steps: 3 Home Layout: One level Home Equipment: Environmental consultant - 2 wheels;Cane - single point Prior Function Level of Independence: Independent with assistive device(s)    Cognition  Cognition Arousal/Alertness: Awake/alert Behavior During Therapy: WFL for tasks assessed/performed Overall Cognitive Status: No family/caregiver present to determine baseline cognitive functioning    Extremity/Trunk Assessment Upper Extremity Assessment Upper Extremity Assessment: Defer to OT evaluation Lower Extremity Assessment Lower  Extremity Assessment: Generalized weakness   Balance Balance Balance Assessed: Yes High Level Balance High Level Balance Activites: Side stepping;Backward walking;Direction changes;Turns;Head turns High Level Balance Comments: some difficulty maneuvering in confined area (bathroom)  End of Session PT - End of Session  Equipment Utilized During Treatment: Gait belt Activity Tolerance: Patient tolerated treatment well Patient left: in chair;with call bell/phone within reach;with chair alarm set Nurse Communication: Mobility status  GP     Fabio Asa 02/20/2013, 3:16 PM Charlotte Crumb, PT DPT  [phone]

## 2013-02-20 NOTE — Progress Notes (Signed)
VASCULAR LAB PRELIMINARY  PRELIMINARY  PRELIMINARY  PRELIMINARY  Carotid Dopplers completed.    Preliminary report:  There is 0-39% ICA stenosis.  Vertebral artery flow is antegrade.  Grace Silva, RVT 02/20/2013, 2:55 PM

## 2013-02-20 NOTE — Progress Notes (Signed)
Clinical Social Work Department BRIEF PSYCHOSOCIAL ASSESSMENT 02/20/2013  Patient:  ENIOLA, CERULLO     Account Number:  [id number]     Admit date:  02/19/2013  Clinical Social Worker:  Carren Rang  Date/Time:  02/20/2013 03:34 PM  Referred by:  Physician  Date Referred:  02/20/2013 Referred for  SNF Placement   Other Referral:   Interview type:  Patient Other interview type:    PSYCHOSOCIAL DATA Living Status:  FAMILY Admitted from facility:   Level of care:   Primary support name:  Hildred Laser Primary support relationship to patient:  CHILD, ADULT Degree of support available:   Good    CURRENT CONCERNS Current Concerns  Post-Acute Placement   Other Concerns:    SOCIAL WORK ASSESSMENT / PLAN Clinical Social Worker received referral for SNF placement at d/c. CSW introduced self and explained reason for visit. CSW explained SNF process and provided SNF packet to patient.  Patient reported she is agreeable for SNF placement. CSW asked patient to speak to family and patient provided information for her son Tomma Lightning. CSW will complete FL2 for MD's signature and will update patient and family when bed offers are received.   Assessment/plan status:  Psychosocial Support/Ongoing Assessment of Needs Other assessment/ plan:   Information/referral to community resources:   SNF information    PATIENT'S/FAMILY'S RESPONSE TO PLAN OF CARE: Patient appeared to be agreeable to SNF placement. CSW spoke to son, Tomma Lightning and he was agreeable to SNF placement as well.       Maree Krabbe, MSW, [facility] [phone]

## 2013-02-21 ENCOUNTER — Encounter (HOSPITAL_COMMUNITY)
Admission: EM | Disposition: A | Discharge status: Home Health | Payer: Self-pay | Source: Home / Self Care | Attending: Internal Medicine

## 2013-02-21 DIAGNOSIS — Z950 Presence of cardiac pacemaker: Secondary | ICD-10-CM

## 2013-02-21 DIAGNOSIS — I6350 Cerebral infarction due to unspecified occlusion or stenosis of unspecified cerebral artery: Principal | ICD-10-CM

## 2013-02-21 DIAGNOSIS — G9340 Encephalopathy, unspecified: Secondary | ICD-10-CM

## 2013-02-21 LAB — BASIC METABOLIC PANEL
CO2: 24 mEq/L (ref 19–32)
Chloride: 110 mEq/L (ref 96–112)
Glucose, Bld: 106 mg/dL — ABNORMAL HIGH (ref 70–99)
Sodium: 142 mEq/L (ref 135–145)

## 2013-02-21 LAB — CBC
Hemoglobin: 12.6 g/dL (ref 12.0–15.0)
MCV: 91.2 fL (ref 78.0–100.0)
Platelets: 115 10*3/uL — ABNORMAL LOW (ref 150–400)
RBC: 4.1 MIL/uL (ref 3.87–5.11)
WBC: 4.7 10*3/uL (ref 4.0–10.5)

## 2013-02-21 LAB — LIPID PANEL
Cholesterol: 196 mg/dL (ref 0–200)
LDL Cholesterol: 138 mg/dL — ABNORMAL HIGH (ref 0–99)
Total CHOL/HDL Ratio: 6.3 RATIO
Triglycerides: 137 mg/dL (ref <150)
VLDL: 27 mg/dL (ref 0–40)

## 2013-02-21 SURGERY — PACEMAKER GENERATOR CHANGE
Anesthesia: LOCAL

## 2013-02-21 NOTE — Progress Notes (Signed)
[facility]  Subjective: No complaints.  Objective: Vital signs in last 24 hours: Temp:  [97.2 F (36.2 C)-98.2 F (36.8 C)] 98.2 F (36.8 C) (07/22 0547) Pulse Rate:  [60-72] 60 (07/22 0547) Resp:  [18-19] 18 (07/22 0547) BP: (161-196)/(48-73) 179/73 mmHg (07/22 0547) SpO2:  [95 %-99 %] 95 % (07/22 0547) Last BM Date: 02/20/13  Intake/Output from previous day: 07/21 0701 - 07/22 0700 In: 2097.5 [I.V.:2097.5] Out: -  Intake/Output this shift:    Medications Current Facility-Administered Medications  Medication Dose Route Frequency Provider Last Rate Last Dose  . 0.9 %  sodium chloride infusion   Intravenous Continuous Doree Albee, MD 75 mL/hr at 02/21/13 0643 75 mL/hr at 02/21/13 0643  . amLODipine (NORVASC) tablet 2.5 mg  2.5 mg Oral Daily Doree Albee, MD   2.5 mg at 02/20/13 1000  . captopril (CAPOTEN) tablet 100 mg  100 mg Oral BID Doree Albee, MD   100 mg at 02/20/13 2223  . clopidogrel (PLAVIX) tablet 75 mg  75 mg Oral Q breakfast Leroy Sea, MD   75 mg at 02/21/13 0730  . levothyroxine (SYNTHROID, LEVOTHROID) tablet 75 mcg  75 mcg Oral QAC breakfast Doree Albee, MD   75 mcg at 02/21/13 0730  . metoprolol succinate (TOPROL-XL) 24 hr tablet 100 mg  100 mg Oral Daily Doree Albee, MD   100 mg at 02/20/13 1000  . sodium chloride 0.9 % injection 3 mL  3 mL Intravenous Q12H Doree Albee, MD        PE: General appearance: alert, cooperative and no distress Lungs: clear to auscultation bilaterally Heart: regular rate and rhythm and 1/6 Pulses: 2+ and symmetric Skin: warm and dry Neurologic: Grossly normal  Lab Results:   Recent Labs  02/19/13 1537 02/19/13 1623 02/21/13 0555  WBC 6.4  --  4.7  HGB 15.2* 16.0* 12.6  HCT 45.6 47.0* 37.4  PLT 145*  --  115*   BMET  Recent Labs  02/19/13 1537 02/19/13 1623 02/21/13 0555  NA 141 142 142  K 3.6 4.3 3.5  CL 101 103 110  CO2 31  --  24  GLUCOSE 97 95 106*  BUN  11 14 22   CREATININE 0.78 0.90 0.90  CALCIUM 9.1  --  8.2*   PT/INR No results found for this basename: LABPROT, INR,  in the last 72 hours Cholesterol  Recent Labs  02/21/13 0555  CHOL 196   Assessment/Plan  Principal Problem:   Acute encephalopathy Active Problems:   CVA (cerebral infarction)   HTN (hypertension)   Fall  Plan: There is concern that patient may have had an acute CVA. Will postpone PM generator change until neurology w/u is complete. EOL ~ 6 weeks. Dr. Royann Shivers to determine timing of replacement.    LOS: 2 days    Brittainy M. Sharol Harness, PA-C 02/21/2013 8:22 AM  I have seen and examined the patient along with Brittainy M. Sharol Harness, PA-C.  I have reviewed the chart, notes and new data.  I agree with PA's note.  Would like to speak to family regarding our options, but definitely prefer to delay procedure until she is more alert. Note that she has not had AFib by pacer interrogation and echo does not show significant structural cardiac abnormalities. Likelihood of cardioembolic stroke is low.  Thurmon Fair, MD, [facility] [phone] 02/21/2013, 9:18 AM

## 2013-02-21 NOTE — Progress Notes (Addendum)
CSW consulted with Case Manager about possible home health option. CM will speak to patient and family and update CSW if she needs to go ahead with SNF placement. CSW signing off. Re consult if social work is needed.   Maree Krabbe, MSW, [facility] [phone]

## 2013-02-21 NOTE — Progress Notes (Signed)
Physical Therapy Treatment Patient Details Name: Grace Silva MRN: [id number] DOB: [birth year] Today's Date: 02/21/2013 Time: 3086-5784 PT Time Calculation (min): 24 min  PT Assessment / Plan / Recommendation  History of Present Illness 79 y.o. year old female with significant past medical history of HTN, blindness, hx/o pacemaker placement presenting with encephalopathy s/p fall.   Clinical Impression Pt continues to present with deficits in balance and functional mobility. At this time given history of falls and current level of mobility patient would need 24/7 assist at all times, if this can not be provided patient will need short term SNF to ensure safety and address functional deficts. Will continue to see as indicated and progress activity as tolerated.       Follow Up Recommendations  Supervision/Assistance - 24 hour (if 24/7 supervision cannot be provided pt will need SNF)     Does the patient have the potential to tolerate intense rehabilitation     Barriers to Discharge        Equipment Recommendations  None recommended by PT    Recommendations for Other Services OT consult  Frequency Min 3X/week   Progress towards PT Goals    Plan      Precautions / Restrictions Precautions Precautions: Fall Restrictions Weight Bearing Restrictions: No (bedrest )   Pertinent Vitals/Pain No pain reported at this time    Mobility  Bed Mobility Bed Mobility: Rolling Right;Right Sidelying to Sit;Sitting - Scoot to [place] of Bed;Sit to Supine Rolling Right: 5: Supervision Right Sidelying to Sit: 5: Supervision Sitting - Scoot to Edge of Bed: 5: Supervision Sit to Supine: 4: Min assist Details for Bed Mobility Assistance: Assist required to return to supine after session Transfers Transfers: Sit to Stand;Stand to Sit Sit to Stand: 4: Min guard Stand to Sit: 4: Min guard Details for Transfer Assistance: From various surfaces including bed, toilet, and  chair Ambulation/Gait Ambulation/Gait Assistance: 4: Min assist Ambulation Distance (Feet): 110 Feet Assistive device: Rolling walker Ambulation/Gait Assistance Details: assit for stability and use of assistive device Gait Pattern: Step-through pattern;Decreased stride length;Shuffle;Trunk flexed;Narrow base of support Gait velocity: decreased General Gait Details: continued instability noted today Stairs: No        PT Diagnosis: Difficulty walking;Generalized weakness  PT Problem List: Decreased strength;Decreased activity tolerance;Decreased balance;Decreased mobility;Decreased coordination;Decreased knowledge of use of DME PT Treatment Interventions: DME instruction;Gait training;Stair training;Functional mobility training;Therapeutic activities;Therapeutic exercise;Balance training;Patient/family education   PT Goals (current goals can now be found in the care plan section) Acute Rehab PT Goals Patient Stated Goal: to go home PT Goal Formulation: With patient Time For Goal Achievement: 03/06/13 Potential to Achieve Goals: Good  Visit Information  Last PT Received On: 02/21/13 Assistance Needed: +1 History of Present Illness: 79 y.o. year old female with significant past medical history of HTN, blindness, hx/o pacemaker placement presenting with encephalopathy s/p fall.    Subjective Data  Subjective: I feel pretty good Patient Stated Goal: to go home   Cognition  Cognition Arousal/Alertness: Awake/alert Behavior During Therapy: WFL for tasks assessed/performed Overall Cognitive Status: No family/caregiver present to determine baseline cognitive functioning    Balance  Balance Balance Assessed: Yes Static Sitting Balance Static Sitting - Balance Support: Feet supported Static Sitting - Level of Assistance: 7: Independent High Level Balance High Level Balance Activites: Side stepping;Backward walking;Direction changes;Turns;Head turns High Level Balance Comments:  continues to demonstrate difficulty in confined environments such as bathroom and certain areas around the room  End of Session PT - End of  Session Equipment Utilized During Treatment: Gait belt Activity Tolerance: Patient tolerated treatment well Patient left: in bed;with call bell/phone within reach;with bed alarm set Nurse Communication: Mobility status   GP     Fabio Asa 02/21/2013, 9:14 AM Charlotte Crumb, PT DPT  [phone]

## 2013-02-21 NOTE — Care Management Note (Unsigned)
    Page 1 of 2   02/21/2013     4:51:49 PM   CARE MANAGEMENT NOTE 02/21/2013  Patient:  Grace Silva, Grace Silva   Account Number:  [id number]  Date Initiated:  02/21/2013  Documentation initiated by:  Luzelena Heeg  Subjective/Objective Assessment:   PT ADM ON 02/19/13 WITH FALL, AMS.  PTA, PT RESIDES AT HOME WITH HER 2 SONS.     Action/Plan:   P.T. CONSULT COMPLETED.  RECOMMENDATION IS THAT PT IS SAFE TO GO HOME WITH HOME HEALTH CARE AT DC.   Anticipated DC Date:  02/22/2013   Anticipated DC Plan:  HOME W HOME HEALTH SERVICES      DC Planning Services  CM consult      [facility] Choice  HOME HEALTH   Choice offered to / List presented to:  C-1 Patient        HH arranged  HH-1 RN  HH-2 PT  HH-3 OT  HH-6 SOCIAL WORKER      HH agency  [facility]   Status of service:  In process, will continue to follow Medicare Important Message given?   (If response is "NO", the following Medicare IM given date fields will be blank) Date Medicare IM given:   Date Additional Medicare IM given:    Discharge Disposition:  HOME W HOME HEALTH SERVICES  Per UR Regulation:  Reviewed for med. necessity/level of care/duration of stay  If discussed at Long Length of Stay Meetings, dates discussed:    Comments:  02/21/13 Viyaan Champine,RN,BSN [phone] SPOKE WITH PT'S SON Tomma Lightning: [phone])  HE IS AGREEABLE TO HOME HEALTH CARE FOR PT AT DISCHARGE.  HE HAS NO PREFERENCE FOR HH AGENCY.  WILL ARRANGE HH WITH [facility]; START OF CARE 24-48H POST DC DATE.

## 2013-02-21 NOTE — Progress Notes (Signed)
[facility]                                                                                Patient Demographics  Grace Silva, is a 79 y.o. female, DOB - [birth year], WJX:[id number], OZH:[id number]  Admit date - 02/19/2013  Admitting Physician Doree Albee, MD  Outpatient Primary MD for the patient is Pamelia Hoit, MD  LOS - 2   Chief Complaint  Patient presents with  . Fatigue  . Fall        Assessment & Plan    1. Acute encephalopathy but no F. N Deficits - in a patient with likely subacute CVA about one week ago per history obtained from family. Continue supportive care, she was on aspirin and I have switched her to Plavix, CT scan of the head shows an acute CVA, she has a pacemaker therefore no MRI, stable carotid ultrasound along with echogram, stable lipid panel and A1c. PT and speech following. Have discussed the plan with her son Tomma Lightning over the phone in detail and her niece plan is gentle medical treatment, placement if needed. No heroics or surgeries. DO NOT RESUSCITATE. She is much better better and seems to be recovering well.  Lab Results  Component Value Date   HGBA1C 5.3 02/20/2013    Lab Results  Component Value Date   CHOL 196 02/21/2013   HDL 31* 02/21/2013   LDLCALC 138* 02/21/2013   TRIG 137 02/21/2013   CHOLHDL 6.3 02/21/2013    2. Hypertension continue present medications. She is on beta blocker, Norvasc and captopril.   3. Sick sinus syndrome. Status post pacemaker, pacemaker stable. Has been seen by cardiology appreciate their input.    4. Legally blind. Supportive care.    5. Hypothyroidism - stable TSH continue home dose Synthroid.   Lab Results  Component Value Date   TSH 1.648 02/20/2013     Code Status: DO NOT RESUSCITATE  Family Communication: Discussed with her niece and son Tomma Lightning over the phone, they both want medical treatment, no surgeries, DO NOT RESUSCITATE. They're agreeable for placement if needed    Disposition Plan: Likely SNF   Procedures CT scan of the head, echogram, carotid ultrasound   Consults  PT, speech, cardiology   DVT Prophylaxis   SCDs    Lab Results  Component Value Date   PLT 115* 02/21/2013    Medications  Scheduled Meds: . amLODipine  2.5 mg Oral Daily  . captopril  100 mg Oral BID  . clopidogrel  75 mg Oral Q breakfast  . levothyroxine  75 mcg Oral QAC breakfast  . metoprolol succinate  100 mg Oral Daily  . sodium chloride  3 mL Intravenous Q12H   Continuous Infusions: . sodium chloride 75 mL/hr (02/21/13 0643)   PRN Meds:.  Antibiotics     Anti-infectives   None       Time Spent in minutes  35   Susa Raring K M.D on 02/21/2013 at 8:57 AM  Between 7am to 7pm - Pager - [phone]  After 7pm go to [email or web address] - password [email or web address]  And look for the night coverage person covering for me after hours  [facility] Office  [phone]    Subjective:   Tishana Clinkenbeard today has, No headache, No chest pain, No abdominal pain - No Nausea, No new weakness tingling or numbness, No Cough - SOB.   Objective:   Filed Vitals:   02/20/13 0653 02/20/13 1330 02/20/13 2207 02/21/13 0547  BP: 164/82 196/48 161/73 179/73  Pulse: 60 72 65 60  Temp:  97.2 F (36.2 C) 97.5 F (36.4 C) 98.2 F (36.8 C)  TempSrc:  Oral Oral Oral  Resp: 19 19 18 18   Height:      Weight:      SpO2: 96% 99% 97% 95%    Wt Readings from Last 3 Encounters:  02/20/13 54.6 kg (120 lb 5.9 oz)  02/20/13 54.6 kg (120 lb 5.9 oz)     Intake/Output Summary (Last 24 hours) at 02/21/13 0857 Last data filed at 02/21/13 0981  Gross per 24 hour  Intake 2097.5 ml  Output      0 ml  Net 2097.5 ml    Exam Awake and alert, oriented x 2 today, No new F.N deficits, Normal affect [place].AT,PERRAL Supple Neck,No JVD, No cervical lymphadenopathy appriciated.  Symmetrical Chest wall movement, Good air movement bilaterally, CTAB RRR,No Gallops,Rubs or new  Murmurs, No Parasternal Heave +ve B.Sounds, Abd Soft, Non tender, No organomegaly appriciated, No rebound - guarding or rigidity. No Cyanosis, Clubbing or edema, No new Rash or bruise     Data Review   Micro Results No results found for this or any previous visit (from the past 240 hour(s)).  Radiology Reports Dg Chest 2 View  02/19/2013   *RADIOLOGY REPORT*  Clinical Data: Fall.  Hypertension.  CHEST - 2 VIEW  Comparison: PA and lateral chest 10/22/2009.  Findings: Heart size is normal.  Lungs are clear.  No pneumothorax or pleural fluid.  Pacing device is noted.  Degenerative disease is seen about the shoulders.  IMPRESSION: No acute disease.   Original Report Authenticated By: Holley Dexter, M.D.   Dg Hip Bilateral Vito Berger  02/19/2013   *RADIOLOGY REPORT*  Clinical Data: Status post fall.  BILATERAL HIP WITH PELVIS - 4+ VIEW  Comparison: The  Findings: No acute bony or joint abnormality is identified.  Mild degenerative change is seen about the hips.  Lower lumbar spondylosis also noted.  IMPRESSION: No acute finding.   Original Report Authenticated By: Holley Dexter, M.D.   Ct Head Wo Contrast  02/19/2013   *RADIOLOGY REPORT*  Clinical Data:  Recurrent falls.  The patient was found down.  CT HEAD WITHOUT CONTRAST CT CERVICAL SPINE WITHOUT CONTRAST  Technique:  Multidetector CT imaging of the head and cervical spine was performed following the standard protocol without intravenous contrast.  Multiplanar CT image reconstructions of the cervical spine were also generated.  Comparison:  Head CT scan 03/27/2008.  Head and cervical spine CT scan 09/28/2007.  CT HEAD  Findings: The brain is atrophic with extensive chronic microvascular ischemic change.  Large, remote right PCA territory infarct is identified.  Since the prior studies, the patient has developed a right basal ganglia infarct which appears subacute to remote.  No hemorrhage, midline shift or abnormal extra-axial fluid collection  is identified.  The calvarium is intact.  IMPRESSION:  1.  Right basal ganglia infarct is new since the 2009 study and appears subacute to remote. 2.  Atrophy, extensive chronic microvascular ischemic change and remote, large right PCA territory infarct.  CT CERVICAL SPINE  Findings: No fracture or subluxation of the cervical spine is identified.  Multilevel degenerative disc disease and facet arthropathy are again seen and show some progression.   The lung apices are clear.  Atherosclerosis is noted.  IMPRESSION: No acute finding.  Multilevel degenerative change shows progression since the prior study.   Original Report Authenticated By: Holley Dexter, M.D.   Ct Cervical Spine Wo Contrast  02/19/2013   *RADIOLOGY REPORT*  Clinical Data:  Recurrent falls.  The patient was found down.  CT HEAD WITHOUT CONTRAST CT CERVICAL SPINE WITHOUT CONTRAST  Technique:  Multidetector CT imaging of the head and cervical spine was performed following the standard protocol without intravenous contrast.  Multiplanar CT image reconstructions of the cervical spine were also generated.  Comparison:  Head CT scan 03/27/2008.  Head and cervical spine CT scan 09/28/2007.  CT HEAD  Findings: The brain is atrophic with extensive chronic microvascular ischemic change.  Large, remote right PCA territory infarct is identified.  Since the prior studies, the patient has developed a right basal ganglia infarct which appears subacute to remote.  No hemorrhage, midline shift or abnormal extra-axial fluid collection is identified.  The calvarium is intact.  IMPRESSION:  1.  Right basal ganglia infarct is new since the 2009 study and appears subacute to remote. 2.  Atrophy, extensive chronic microvascular ischemic change and remote, large right PCA territory infarct.  CT CERVICAL SPINE  Findings: No fracture or subluxation of the cervical spine is identified.  Multilevel degenerative disc disease and facet arthropathy are again seen and show  some progression.   The lung apices are clear.  Atherosclerosis is noted.  IMPRESSION: No acute finding.  Multilevel degenerative change shows progression since the prior study.   Original Report Authenticated By: Holley Dexter, M.D.   Dg Knee Complete 4 Views Left  02/19/2013   *RADIOLOGY REPORT*  Clinical Data: Status post fall  LEFT KNEE - COMPLETE 4+ VIEW  Comparison: None.  Findings: There is no joint effusion.  Sharpening tibial spines and marginal spur formation is identified compatible with osteoarthritis.  No fractures or dislocations identified.  No radio-opaque foreign body or soft tissue calcification.  IMPRESSION:  1.  Osteoarthritis. 2.  No acute findings.   Original Report Authenticated By: Signa Kell, M.D.    [facility] [place]  Recent Labs Lab 02/16/13 1336 02/19/13 1537 02/19/13 1623 02/21/13 0555  WBC 6.1 6.4  --  4.7  HGB 14.8 15.2* 16.0* 12.6  HCT 43.5 45.6 47.0* 37.4  PLT 153 145*  --  115*  MCV 87.7 91.0  --  91.2  MCH 29.8 30.3  --  30.7  MCHC 34.0 33.3  --  33.7  RDW 15.4 14.4  --  15.2  LYMPHSABS 1.7 1.4  --   --   MONOABS 0.5 0.6  --   --   EOSABS 0.1 0.1  --   --   BASOSABS 0.1 0.1  --   --     Chemistries   Recent Labs Lab 02/16/13 1336 02/19/13 1537 02/19/13 1623 02/21/13 0555  NA 142 141 142 142  K 3.4* 3.6 4.3 3.5  CL 102 101 103 110  CO2 26 31  --  24  GLUCOSE 144* 97 95 106*  BUN 15 11 14 22   CREATININE 0.98 0.78 0.90 0.90  CALCIUM 8.7 9.1  --  8.2*  AST 20 21  --   --   ALT 9 9  --   --   ALKPHOS 59 68  --   --   BILITOT 0.5 0.5  --   --    ------------------------------------------------------------------------------------------------------------------  estimated creatinine clearance is 35.1 ml/min (by C-G formula based on Cr of 0.9). ------------------------------------------------------------------------------------------------------------------  Recent Labs  02/20/13 0240  HGBA1C 5.3    ------------------------------------------------------------------------------------------------------------------  Recent Labs  02/21/13 0555  CHOL 196  HDL 31*  LDLCALC 138*  TRIG 137  CHOLHDL 6.3   ------------------------------------------------------------------------------------------------------------------  Recent Labs  02/20/13 0240  TSH 1.648   ------------------------------------------------------------------------------------------------------------------ No results found for this basename: VITAMINB12, FOLATE, FERRITIN, TIBC, IRON, RETICCTPCT,  in the last 72 hours  Coagulation profile  Recent Labs Lab 02/16/13 1336  INR 0.97    No results found for this basename: DDIMER,  in the last 72 hours  Cardiac Enzymes  Recent Labs Lab 02/20/13 0520 02/20/13 1055 02/20/13 1426  TROPONINI <0.30 <0.30 <0.30   ------------------------------------------------------------------------------------------------------------------ No components found with this basename: POCBNP,

## 2013-02-22 DIAGNOSIS — I10 Essential (primary) hypertension: Secondary | ICD-10-CM

## 2013-02-22 DIAGNOSIS — Z45018 Encounter for adjustment and management of other part of cardiac pacemaker: Secondary | ICD-10-CM

## 2013-02-22 LAB — BASIC METABOLIC PANEL
BUN: 18 mg/dL (ref 6–23)
Calcium: 8.5 mg/dL (ref 8.4–10.5)
Creatinine, Ser: 0.81 mg/dL (ref 0.50–1.10)
GFR calc Af Amer: 71 mL/min — ABNORMAL LOW (ref >90)
GFR calc non Af Amer: 62 mL/min — ABNORMAL LOW (ref >90)

## 2013-02-22 MED ORDER — SIMVASTATIN 10 MG PO TABS
10.0000 mg | ORAL_TABLET | Freq: Every day | ORAL | Status: DC
  Filled 2013-02-22: qty 1

## 2013-02-22 MED ORDER — HYDRALAZINE HCL 20 MG/ML IJ SOLN
10.0000 mg | Freq: Once | INTRAMUSCULAR | Status: AC
  Administered 2013-02-22: 10 mg via INTRAVENOUS
  Filled 2013-02-22: qty 1

## 2013-02-22 MED ORDER — SIMVASTATIN 10 MG PO TABS: 10.0000 mg | ORAL_TABLET | Freq: Every day | ORAL | Status: DC

## 2013-02-22 MED ORDER — CLOPIDOGREL BISULFATE 75 MG PO TABS: 75.0000 mg | ORAL_TABLET | Freq: Every day | ORAL | Status: DC

## 2013-02-22 MED ORDER — AMLODIPINE BESYLATE 2.5 MG PO TABS: 5.0000 mg | ORAL_TABLET | Freq: Every day | ORAL | Status: DC

## 2013-02-22 MED ORDER — POTASSIUM CHLORIDE CRYS ER 20 MEQ PO TBCR
40.0000 meq | EXTENDED_RELEASE_TABLET | Freq: Two times a day (BID) | ORAL | Status: DC
  Administered 2013-02-22: 40 meq via ORAL
  Filled 2013-02-22: qty 2

## 2013-02-22 MED ORDER — HYDRALAZINE HCL 20 MG/ML IJ SOLN: 2.0000 mg | Freq: Three times a day (TID) | INTRAMUSCULAR | Status: DC | PRN

## 2013-02-22 MED ORDER — AMLODIPINE BESYLATE 5 MG PO TABS
5.0000 mg | ORAL_TABLET | Freq: Every day | ORAL | Status: DC
  Administered 2013-02-22: 5 mg via ORAL
  Filled 2013-02-22: qty 1

## 2013-02-22 NOTE — Telephone Encounter (Signed)
erx done on 02-17-2013

## 2013-02-22 NOTE — Progress Notes (Signed)
    Subjective: Doing well with no complaints.  Objective: Vital signs in last 24 hours: Temp:  [97.2 F (36.2 C)-97.8 F (36.6 C)] 97.8 F (36.6 C) (07/23 0605) Pulse Rate:  [67-72] 69 (07/23 0800) Resp:  [16-18] 16 (07/23 0342) BP: (154-221)/(60-108) 154/108 mmHg (07/23 0800) SpO2:  [97 %-100 %] 98 % (07/23 0605) Last BM Date: 02/20/13  Intake/Output from previous day: 07/22 0701 - 07/23 0700 In: 280 [P.O.:280] Out: -  Intake/Output this shift:    Medications Current Facility-Administered Medications  Medication Dose Route Frequency Provider Last Rate Last Dose  . amLODipine (NORVASC) tablet 5 mg  5 mg Oral Daily Kathlen Mody, MD      . captopril (CAPOTEN) tablet 100 mg  100 mg Oral BID Doree Albee, MD   100 mg at 02/21/13 2105  . clopidogrel (PLAVIX) tablet 75 mg  75 mg Oral Q breakfast Leroy Sea, MD   75 mg at 02/22/13 4098  . hydrALAZINE (APRESOLINE) injection 2 mg  2 mg Intravenous Q8H PRN Kathlen Mody, MD      . levothyroxine (SYNTHROID, LEVOTHROID) tablet 75 mcg  75 mcg Oral QAC breakfast Doree Albee, MD   75 mcg at 02/21/13 0730  . metoprolol succinate (TOPROL-XL) 24 hr tablet 100 mg  100 mg Oral Daily Doree Albee, MD   100 mg at 02/21/13 1041  . potassium chloride SA (K-DUR,KLOR-CON) CR tablet 40 mEq  40 mEq Oral BID Kathlen Mody, MD      . sodium chloride 0.9 % injection 3 mL  3 mL Intravenous Q12H Doree Albee, MD   3 mL at 02/21/13 2105    PE: General appearance: alert, cooperative and no distress Lungs: clear to auscultation bilaterally Heart: regular rate and rhythm and 1/6 sys MM Extremities: No LEE Pulses: 2+ and symmetric Neurologic: Grossly normal  Lab Results:   Recent Labs  02/19/13 1537 02/19/13 1623 02/21/13 0555  WBC 6.4  --  4.7  HGB 15.2* 16.0* 12.6  HCT 45.6 47.0* 37.4  PLT 145*  --  115*   BMET  Recent Labs  02/19/13 1537 02/19/13 1623 02/21/13 0555 02/22/13 0350  NA 141 142 142 142  K 3.6 4.3 3.5 3.4*  CL  101 103 110 108  CO2 31  --  24 24  GLUCOSE 97 95 106* 98  BUN 11 14 22 18   CREATININE 0.78 0.90 0.90 0.81  CALCIUM 9.1  --  8.2* 8.5    Recent Labs  02/21/13 0555  CHOL 196   Lipid Panel     Component Value Date/Time   CHOL 196 02/21/2013 0555   TRIG 137 02/21/2013 0555   HDL 31* 02/21/2013 0555   CHOLHDL 6.3 02/21/2013 0555   VLDL 27 02/21/2013 0555   LDLCALC 138* 02/21/2013 0555       Assessment/Plan  Principal Problem:   Acute encephalopathy Active Problems:   CVA (cerebral infarction)   HTN (hypertension)   Fall  Plan:   V-pacing on tele.  No MI.  Normal EF by echo.  Carotid dopplers: Bilateral: moderate to severe calcific plaque distal CCA and origin ICA. 0-39% ICA stenosis. Vertebral artery flow is antegrade.  Generator change will be rescheduled.  PM battery has 6 weeks of normal function left.      LOS: 3 days    Cherrise Occhipinti 02/22/2013 8:44 AM

## 2013-02-22 NOTE — Progress Notes (Signed)
Speech Language Pathology Dysphagia Treatment Patient Details Name: Grace Silva MRN: [id number] DOB: 1919/07/25 Today's Date: 02/22/2013 Time: 4098-1191 SLP Time Calculation (min): 8 min  Assessment / Plan / Recommendation Clinical Impression  F/u after clinical swallow eval on 7/21.  Pt observed with a few items from lunch tray.  Presents with adequate mastication despite lack of teeth, swift swallow trigger, and protective swallow response with no indications of aspiration.  No cueing necessary; No further f/u needed - will sign off.     Diet Recommendation  Continue with Current Diet: Dysphagia 3 (mechanical soft);Thin liquid    SLP Plan All goals met   Pertinent Vitals/Pain No pain   Swallowing Goals  SLP Swallowing Goals Patient will utilize recommended strategies during swallow to increase swallowing safety with: Minimal cueing Swallow Study Goal #2 - Progress: Met  General Temperature Spikes Noted: No Respiratory Status: Room air Behavior/Cognition: Alert;Cooperative;Pleasant mood;Requires cueing Oral Cavity - Dentition: Dentures, not available;Edentulous Patient Positioning: Upright in chair  Oral Cavity - Oral Hygiene Does patient have any of the following "at risk" factors?: None of the above   Dysphagia Treatment Treatment focused on: Skilled observation of diet tolerance Treatment Methods/Modalities: Skilled observation Patient observed directly with PO's: Yes Type of PO's observed: Dysphagia 3 (soft);Thin liquids Feeding: Able to feed self;Needs assist Liquids provided via: Cup;Straw Type of cueing:  (none) Amount of cueing:  (none)   GO     Carolan Shiver 02/22/2013, 1:24 PM

## 2013-02-22 NOTE — Discharge Summary (Signed)
Physician Discharge Summary  Raedyn Klinck Campise ZOX:[id number] DOB: Sep 29, 1918 DOA: 02/19/2013  PCP: Pamelia Hoit, MD  Admit date: 02/19/2013 Discharge date: 02/22/2013  Time spent: 35 minutes  Recommendations for Outpatient Follow-up:  Follow up with cardiology as recommended.  Please follow up with neurology - [facility] in 2 weeks.  Follow up with PCP in one week.  Discharge Diagnoses:  Principal Problem:   Acute encephalopathy Active Problems:   CVA (cerebral infarction)   HTN (hypertension)   Fall   Discharge Condition: improved.   Diet recommendation: low sodium diet  Filed Weights   02/20/13 0414  Weight: 54.6 kg (120 lb 5.9 oz)    History of present illness:  This is a 79 y.o. year old female with significant past medical history of HTN, blindness, hx/o pacemaker placement presenting with encephalopathy s/p fall. Pt had an unwitnessed fall at home earlier today. Pt was seen a [facility] for sxs. Workup included bloodwork and head/c spine CT as well as multiple [facility] xrays. Workup essentially normal apart from ? ? Subacute basal ganglia infarct on head CT as well as chronic microvascular changes. Per niece, Pt was returned back to baseline at [facility] and was discharged. When pt returned home with niece, pt became weakness and slowly fell to ground and became unresponsive again. Pt was then brought to ER for further eval. Per niece, pt lives at home with her sons who do not follow pt closely. Pt has had 2 unwitnessed falls over the last week. No recent infections. No reports of vomiting, fever, diarrhea.   Cardiologist [facility]. Dr. Deirdre Pippins.    Hospital Course:  1. Acute encephalopathy but no Focal Neurological Deficits - in a patient with likely subacute CVA about one week ago per history obtained from family. Continue supportive care, she was on aspirin and I have switched her to Plavix, CT scan of the head shows an acute CVA, she has a pacemaker therefore no MRI, stable carotid  ultrasound along with echogram, stable lipid panel and A1c. PT and speech following. Have discussed the plan with her son Tomma Lightning over the phone in detail and her niece plan is gentle medical treatment, placement if needed. No heroics or surgeries. DO NOT RESUSCITATE. She is much better better and seems to be recovering well.  2. Hypertension; her repeat BP is 160/60 mmhg manually. Resume home medications.  3. Sick sinus syndrome. Status post pacemaker, pacemaker stable.cardiology on board and outpatient follow up.  4. Legally blind. Supportive care.  5. Hypothyroidism - stable TSH continue home dose Synthroid.  6. Hypokalemia; repleted as needed.    Procedures:  CT head without contrast.   Consultations:  cardiology  Discharge Exam: Filed Vitals:   02/22/13 0342 02/22/13 0449 02/22/13 0605 02/22/13 0800  BP: 218/92 181/60 221/90 154/108  Pulse: 72 69  69  Temp: 97.7 F (36.5 C)  97.8 F (36.6 C)   TempSrc: Oral  Oral   Resp: 16     Height:      Weight:      SpO2: 100%  98%     General: alert afebrile comfortable Cardiovascular: s1s2 Respiratory: CTAB.   Discharge Instructions  Discharge Orders   Future Orders Complete By Expires     Diet - low sodium heart healthy  As directed     Discharge instructions  As directed     Comments:      Follow up with cardiology as recommended.  Follow up with PCP in one week and check BMP  in one week.    Increase activity slowly  As directed         Medication List    STOP taking these medications       aspirin 81 MG chewable tablet      TAKE these medications       amLODipine 2.5 MG tablet  Commonly known as:  NORVASC  Take 2 tablets (5 mg total) by mouth daily.     captopril 100 MG tablet  Commonly known as:  CAPOTEN  Take 100 mg by mouth 2 (two) times daily.     clopidogrel 75 MG tablet  Commonly known as:  PLAVIX  Take 1 tablet (75 mg total) by mouth daily with breakfast.     levothyroxine 75 MCG tablet   Commonly known as:  SYNTHROID, LEVOTHROID  Take 75 mcg by mouth daily before breakfast.     metoprolol succinate 100 MG 24 hr tablet  Commonly known as:  TOPROL-XL  Take 100 mg by mouth daily. Take with or immediately following a meal.     simvastatin 10 MG tablet  Commonly known as:  ZOCOR  Take 1 tablet (10 mg total) by mouth daily at 6 PM.       No Known Allergies    The results of significant diagnostics from this hospitalization (including imaging, microbiology, ancillary and laboratory) are listed below for reference.    Significant Diagnostic Studies: Dg Chest 2 View  02/20/2013   *RADIOLOGY REPORT*  Clinical Data: Shortness of breath  CHEST - 2 VIEW  Comparison: The 02/19/2013  Findings: The cardiac shadow is within normal limits.  A pacing device is again seen.  The lungs are clear bilaterally.  No acute bony abnormality is seen.  IMPRESSION: No acute abnormality noted.   Original Report Authenticated By: Alcide Clever, M.D.   Dg Chest 2 View  02/19/2013   *RADIOLOGY REPORT*  Clinical Data: Fall.  Hypertension.  CHEST - 2 VIEW  Comparison: PA and lateral chest 10/22/2009.  Findings: Heart size is normal.  Lungs are clear.  No pneumothorax or pleural fluid.  Pacing device is noted.  Degenerative disease is seen about the shoulders.  IMPRESSION: No acute disease.   Original Report Authenticated By: Holley Dexter, M.D.   Dg Hip Bilateral Vito Berger  02/19/2013   *RADIOLOGY REPORT*  Clinical Data: Status post fall.  BILATERAL HIP WITH PELVIS - 4+ VIEW  Comparison: The  Findings: No acute bony or joint abnormality is identified.  Mild degenerative change is seen about the hips.  Lower lumbar spondylosis also noted.  IMPRESSION: No acute finding.   Original Report Authenticated By: Holley Dexter, M.D.   Ct Head Wo Contrast  02/19/2013   *RADIOLOGY REPORT*  Clinical Data:  Recurrent falls.  The patient was found down.  CT HEAD WITHOUT CONTRAST CT CERVICAL SPINE WITHOUT CONTRAST   Technique:  Multidetector CT imaging of the head and cervical spine was performed following the standard protocol without intravenous contrast.  Multiplanar CT image reconstructions of the cervical spine were also generated.  Comparison:  Head CT scan 03/27/2008.  Head and cervical spine CT scan 09/28/2007.  CT HEAD  Findings: The brain is atrophic with extensive chronic microvascular ischemic change.  Large, remote right PCA territory infarct is identified.  Since the prior studies, the patient has developed a right basal ganglia infarct which appears subacute to remote.  No hemorrhage, midline shift or abnormal extra-axial fluid collection is identified.  The calvarium is intact.  IMPRESSION:  1.  Right basal ganglia infarct is new since the 2009 study and appears subacute to remote. 2.  Atrophy, extensive chronic microvascular ischemic change and remote, large right PCA territory infarct.  CT CERVICAL SPINE  Findings: No fracture or subluxation of the cervical spine is identified.  Multilevel degenerative disc disease and facet arthropathy are again seen and show some progression.   The lung apices are clear.  Atherosclerosis is noted.  IMPRESSION: No acute finding.  Multilevel degenerative change shows progression since the prior study.   Original Report Authenticated By: Holley Dexter, M.D.   Ct Cervical Spine Wo Contrast  02/19/2013   *RADIOLOGY REPORT*  Clinical Data:  Recurrent falls.  The patient was found down.  CT HEAD WITHOUT CONTRAST CT CERVICAL SPINE WITHOUT CONTRAST  Technique:  Multidetector CT imaging of the head and cervical spine was performed following the standard protocol without intravenous contrast.  Multiplanar CT image reconstructions of the cervical spine were also generated.  Comparison:  Head CT scan 03/27/2008.  Head and cervical spine CT scan 09/28/2007.  CT HEAD  Findings: The brain is atrophic with extensive chronic microvascular ischemic change.  Large, remote right PCA  territory infarct is identified.  Since the prior studies, the patient has developed a right basal ganglia infarct which appears subacute to remote.  No hemorrhage, midline shift or abnormal extra-axial fluid collection is identified.  The calvarium is intact.  IMPRESSION:  1.  Right basal ganglia infarct is new since the 2009 study and appears subacute to remote. 2.  Atrophy, extensive chronic microvascular ischemic change and remote, large right PCA territory infarct.  CT CERVICAL SPINE  Findings: No fracture or subluxation of the cervical spine is identified.  Multilevel degenerative disc disease and facet arthropathy are again seen and show some progression.   The lung apices are clear.  Atherosclerosis is noted.  IMPRESSION: No acute finding.  Multilevel degenerative change shows progression since the prior study.   Original Report Authenticated By: Holley Dexter, M.D.   Dg Knee Complete 4 Views Left  02/19/2013   *RADIOLOGY REPORT*  Clinical Data: Status post fall  LEFT KNEE - COMPLETE 4+ VIEW  Comparison: None.  Findings: There is no joint effusion.  Sharpening tibial spines and marginal spur formation is identified compatible with osteoarthritis.  No fractures or dislocations identified.  No radio-opaque foreign body or soft tissue calcification.  IMPRESSION:  1.  Osteoarthritis. 2.  No acute findings.   Original Report Authenticated By: Signa Kell, M.D.    Microbiology: No results found for this or any previous visit (from the past 240 hour(s)).   Labs: Basic Metabolic Panel:  Recent Labs Lab 02/16/13 1336 02/19/13 1537 02/19/13 1623 02/21/13 0555 02/22/13 0350  NA 142 141 142 142 142  K 3.4* 3.6 4.3 3.5 3.4*  CL 102 101 103 110 108  CO2 26 31  --  24 24  GLUCOSE 144* 97 95 106* 98  BUN 15 11 14 22 18   CREATININE 0.98 0.78 0.90 0.90 0.81  CALCIUM 8.7 9.1  --  8.2* 8.5   Liver Function Tests:  Recent Labs Lab 02/16/13 1336 02/19/13 1537  AST 20 21  ALT 9 9  ALKPHOS  59 68  BILITOT 0.5 0.5  PROT 6.1 6.8  ALBUMIN 4.0 3.6   No results found for this basename: LIPASE, AMYLASE,  in the last 168 hours  Recent Labs Lab 02/20/13 0520  AMMONIA 29   CBC:  Recent Labs Lab 02/16/13 1336 02/19/13 1537  02/19/13 1623 02/21/13 0555  WBC 6.1 6.4  --  4.7  NEUTROABS 3.8 4.3  --   --   HGB 14.8 15.2* 16.0* 12.6  HCT 43.5 45.6 47.0* 37.4  MCV 87.7 91.0  --  91.2  PLT 153 145*  --  115*   Cardiac Enzymes:  Recent Labs Lab 02/19/13 1537 02/20/13 0520 02/20/13 1055 02/20/13 1426  CKTOTAL 222*  --   --   --   TROPONINI  --  <0.30 <0.30 <0.30   BNP: BNP (last 3 results)  Recent Labs  02/20/13 0520  PROBNP 2450.0*   CBG: No results found for this basename: GLUCAP,  in the last 168 hours     Signed:  Orrie Schubert  [facility] 02/22/2013, 12:40 PM

## 2013-02-22 NOTE — Progress Notes (Signed)
Pt/family given discharge instructions, medication lists, follow up appointments, and when to call the doctor.  Pt/family verbalizes understanding. Grace Silva    

## 2013-02-22 NOTE — Progress Notes (Signed)
Pt. Seen and examined. Agree with the NP/PA-C note as written.  Much more awake today than described earlier. Aware of place, year and self. She is at [facility] (elective replacement interval), not end-of-life. Will arrange for elective outpatient pacemaker generator change in the next few weeks with Dr. Royann Shivers. Will sign-off. Call with questions.  Chrystie Nose, MD, [facility] Attending Cardiologist [facility]

## 2013-02-23 NOTE — ED Provider Notes (Signed)
Medical screening examination/treatment/procedure(s) were conducted as a shared visit with non-physician practitioner(s) and myself.  I personally evaluated the patient during the encounter.  89yF presenting after fall. Pt not clear as to exact circumstances, but reports that falls frequently. ED fairly unremarkable. Basal ganglia infarct of unclear exact chronicity, but doesn't appear acute. Pt at her baseline per daughter at bedside. She has no complaints for me. Pt lives with sons but apparently have issues with PTSD and are not reliable caretakers. Daughter will watch pt tonight. Attempted to have social work meet with pt/daughter but unfortunately unavailable at this time.   Raeford Razor, MD 02/23/13 [phone]

## 2013-02-26 ENCOUNTER — Emergency Department (HOSPITAL_COMMUNITY)
Admission: EM | Admit: 2013-02-26 | Discharge: 2013-02-28 | Disposition: A | Discharge status: Skilled Nursing Facility | Payer: Medicare HMO | Attending: Emergency Medicine | Admitting: Emergency Medicine

## 2013-02-26 ENCOUNTER — Encounter (HOSPITAL_COMMUNITY): Payer: Self-pay | Admitting: Emergency Medicine

## 2013-02-26 DIAGNOSIS — E860 Dehydration: Secondary | ICD-10-CM | POA: Insufficient documentation

## 2013-02-26 DIAGNOSIS — Z950 Presence of cardiac pacemaker: Secondary | ICD-10-CM | POA: Insufficient documentation

## 2013-02-26 DIAGNOSIS — Z79899 Other long term (current) drug therapy: Secondary | ICD-10-CM | POA: Insufficient documentation

## 2013-02-26 DIAGNOSIS — R627 Adult failure to thrive: Secondary | ICD-10-CM | POA: Insufficient documentation

## 2013-02-26 DIAGNOSIS — E079 Disorder of thyroid, unspecified: Secondary | ICD-10-CM | POA: Insufficient documentation

## 2013-02-26 DIAGNOSIS — I10 Essential (primary) hypertension: Secondary | ICD-10-CM | POA: Insufficient documentation

## 2013-02-26 DIAGNOSIS — IMO0002 Reserved for concepts with insufficient information to code with codable children: Secondary | ICD-10-CM

## 2013-02-26 HISTORY — DX: Hyperlipidemia, unspecified: E78.5

## 2013-02-26 HISTORY — DX: Transient cerebral ischemic attack, unspecified: G45.9

## 2013-02-26 HISTORY — DX: Unspecified dementia, unspecified severity, without behavioral disturbance, psychotic disturbance, mood disturbance, and anxiety: F03.90

## 2013-02-26 LAB — CBC WITH DIFFERENTIAL/PLATELET
Basophils Absolute: 0.1 10*3/uL (ref 0.0–0.1)
Basophils Relative: 2 % — ABNORMAL HIGH (ref 0–1)
HCT: 42.1 % (ref 36.0–46.0)
MCHC: 34.2 g/dL (ref 30.0–36.0)
Monocytes Absolute: 0.5 10*3/uL (ref 0.1–1.0)
Neutro Abs: 3.3 10*3/uL (ref 1.7–7.7)
Neutrophils Relative %: 60 % (ref 43–77)
Platelets: 155 10*3/uL (ref 150–400)
RDW: 14.8 % (ref 11.5–15.5)
WBC: 5.6 10*3/uL (ref 4.0–10.5)

## 2013-02-26 LAB — BASIC METABOLIC PANEL
Chloride: 100 mEq/L (ref 96–112)
Creatinine, Ser: 0.94 mg/dL (ref 0.50–1.10)
GFR calc Af Amer: 60 mL/min — ABNORMAL LOW (ref >90)
Sodium: 136 mEq/L (ref 135–145)

## 2013-02-26 LAB — URINALYSIS, ROUTINE W REFLEX MICROSCOPIC
Ketones, ur: 40 mg/dL — AB
Leukocytes, UA: NEGATIVE
Nitrite: NEGATIVE
Protein, ur: NEGATIVE mg/dL
pH: 6 (ref 5.0–8.0)

## 2013-02-26 MED ORDER — CAPTOPRIL 100 MG PO TABS
100.0000 mg | ORAL_TABLET | Freq: Two times a day (BID) | ORAL | Status: DC
  Administered 2013-02-26 – 2013-02-28 (×4): 100 mg via ORAL
  Filled 2013-02-26 (×5): qty 1

## 2013-02-26 MED ORDER — AMLODIPINE BESYLATE 5 MG PO TABS
5.0000 mg | ORAL_TABLET | Freq: Every day | ORAL | Status: DC
  Administered 2013-02-26 – 2013-02-28 (×3): 5 mg via ORAL
  Filled 2013-02-26 (×3): qty 1

## 2013-02-26 MED ORDER — SIMVASTATIN 10 MG PO TABS
10.0000 mg | ORAL_TABLET | Freq: Every day | ORAL | Status: DC
  Administered 2013-02-26 – 2013-02-27 (×2): 10 mg via ORAL
  Filled 2013-02-26 (×4): qty 1

## 2013-02-26 MED ORDER — SODIUM CHLORIDE 0.9 % IV SOLN
INTRAVENOUS | Status: DC
  Administered 2013-02-26: 12:00:00 via INTRAVENOUS

## 2013-02-26 MED ORDER — LEVOTHYROXINE SODIUM 75 MCG PO TABS
75.0000 ug | ORAL_TABLET | Freq: Every day | ORAL | Status: DC
  Administered 2013-02-27 – 2013-02-28 (×2): 75 ug via ORAL
  Filled 2013-02-26 (×3): qty 1

## 2013-02-26 MED ORDER — METOPROLOL SUCCINATE ER 100 MG PO TB24
100.0000 mg | ORAL_TABLET | Freq: Every day | ORAL | Status: DC
  Administered 2013-02-27 – 2013-02-28 (×2): 100 mg via ORAL
  Filled 2013-02-26 (×2): qty 1

## 2013-02-26 MED ORDER — CLOPIDOGREL BISULFATE 75 MG PO TABS
75.0000 mg | ORAL_TABLET | Freq: Every day | ORAL | Status: DC
  Administered 2013-02-27 – 2013-02-28 (×2): 75 mg via ORAL
  Filled 2013-02-26 (×2): qty 1

## 2013-02-26 NOTE — ED Notes (Signed)
Pt's  Niece brought her in to ed, stating that she is not being cared for at home. Home health nurse found her soaked with urine, had not been fed and meds not given. Pt lives with oldest son. Son sent note stating he is unable to care for her and would like her to go to a nursing home. Pt denies pain and did not know why she was brought in.

## 2013-02-26 NOTE — ED Provider Notes (Signed)
CSN: [id number]     Arrival date & time 02/26/13  1105 History     First MD Initiated Contact with Patient 02/26/13 1110     Chief Complaint  Patient presents with  . Dehydration   (Consider location/radiation/quality/duration/timing/severity/associated sxs/prior Treatment) HPI Level 5 caveat due to confusion Pt brought to the ED by niece who is concerned for her safety at home. The patient has been living with her son who, per niece, has not been taking care of her. She states the patient is not being fed or given liquids, not given medications or taken to her doctor and lying in bed in urine soaked clothes. The patient was admitted for AMS about a week ago and discharged back to son's home 4 days ago. Niece states home health was at the house today and advised her to bring the patient back to the ED due to poor conditions at home. Pt is blind. Has pacemaker. Pt denies any complaints.   Past Medical History  Diagnosis Date  . Thyroid disease   . Legally blind   . Hypertension   . Pacemaker    Past Surgical History  Procedure Laterality Date  . Pacemaker placement     History reviewed. No pertinent family history. History  Substance Use Topics  . Smoking status: Never Smoker   . Smokeless tobacco: Never Used  . Alcohol Use: No   OB History   Grav Para Term Preterm Abortions TAB SAB Ect Mult Living                 Review of Systems Unable to assess due to mental status.   Allergies  Review of patient's allergies indicates no known allergies.  Home Medications   Current Outpatient Rx  Name  Route  Sig  Dispense  Refill  . amLODipine (NORVASC) 2.5 MG tablet   Oral   Take 2 tablets (5 mg total) by mouth daily.   30 tablet   1   . captopril (CAPOTEN) 100 MG tablet   Oral   Take 100 mg by mouth 2 (two) times daily.         . clopidogrel (PLAVIX) 75 MG tablet   Oral   Take 1 tablet (75 mg total) by mouth daily with breakfast.   30 tablet   1   . levothyroxine  (SYNTHROID, LEVOTHROID) 75 MCG tablet   Oral   Take 75 mcg by mouth daily before breakfast.         . metoprolol succinate (TOPROL-XL) 100 MG 24 hr tablet   Oral   Take 100 mg by mouth daily. Take with or immediately following a meal.         . simvastatin (ZOCOR) 10 MG tablet   Oral   Take 1 tablet (10 mg total) by mouth daily at 6 PM.   30 tablet   1    BP 155/69  Pulse 60  Temp(Src) 98 F (36.7 C) (Oral)  SpO2 98% Physical Exam  Nursing note and vitals reviewed. Constitutional: She appears well-developed and well-nourished.  HENT:  Head: Normocephalic and atraumatic.  Dry mouth  Eyes: EOM are normal.  Neck: Normal range of motion. Neck supple.  Cardiovascular: Normal rate, normal heart sounds and intact distal pulses.   Pulmonary/Chest: Effort normal and breath sounds normal.  Abdominal: Bowel sounds are normal. She exhibits no distension. There is no tenderness.  Musculoskeletal: Normal range of motion. She exhibits no edema and no tenderness.  Neurological: She is alert.  She has normal strength. No cranial nerve deficit or sensory deficit.  Skin: Skin is warm and dry. No rash noted.  Psychiatric: She has a normal mood and affect.    ED Course   Procedures (including critical care time)  Labs Reviewed  CBC WITH DIFFERENTIAL - Abnormal; Notable for the following:    Basophils Relative 2 (*)    All other components within normal limits  BASIC METABOLIC PANEL - Abnormal; Notable for the following:    Glucose, Bld 118 (*)    GFR calc non Af Amer 51 (*)    GFR calc Af Amer 60 (*)    All other components within normal limits  URINALYSIS, ROUTINE W REFLEX MICROSCOPIC - Abnormal; Notable for the following:    Ketones, ur 40 (*)    All other components within normal limits   No results found. 1. Dehydration   2. Failure to thrive     MDM   Date: 02/26/2013  Rate: 60  Rhythm: AV paced  QRS Axis: indeterminate  Intervals: normal  ST/T Wave abnormalities:  indeterminate  Conduction Disutrbances:paced  Narrative Interpretation:   Old EKG Reviewed: none available  5:40 PM Pt with mild dehydration by ketonuria, but no other lab abnormalities to necessitate re-admission. Discussed with Social Worker who has seen her in the ED and requests that PT evaluation be done to help facilitate placement in SNF. Will need to hold in the ED until at least tomorrow pending placement.    Ziza Hastings B. Bernette Mayers, MD 02/26/13 [phone]

## 2013-02-26 NOTE — Progress Notes (Signed)
Clinical Social Work Department CLINICAL SOCIAL WORK PLACEMENT NOTE 02/26/2013  Patient:  Grace Silva, Grace Silva  Account Number:  [id number] Admit date:  02/26/2013  Clinical Social Worker:  Jacelyn Grip  Date/time:  02/26/2013 04:00 PM  Clinical Social Work is seeking post-discharge placement for this patient at the following level of care:   SKILLED NURSING   (*CSW will update this form in Epic as items are completed)   02/26/2013  Patient/family provided with [facility] Department of Clinical Social Work's list of facilities offering this level of care within the geographic area requested by the patient (or if unable, by the patient's family).  02/26/2013  Patient/family informed of their freedom to choose among providers that offer the needed level of care, that participate in Medicare, Medicaid or managed care program needed by the patient, have an available bed and are willing to accept the patient.  02/26/2013  Patient/family informed of [facility]' ownership interest in [facility], as well as of the fact that they are under no obligation to receive care at this facility.  PASARR submitted to EDS on 02/26/2013 PASARR number received from EDS on 02/26/2013  FL2 transmitted to all facilities in geographic area requested by pt/family on  02/26/2013 FL2 transmitted to all facilities within larger geographic area on   Patient informed that his/her managed care company has contracts with or will negotiate with  certain facilities, including the following:     Patient/family informed of bed offers received:   Patient chooses bed at  Physician recommends and patient chooses bed at    Patient to be transferred to  on   Patient to be transferred to facility by   The following physician request were entered in Epic:   Additional Comments:    Jacklynn Lewis, MSW, [employer]  Clinical Social Work Weekend coverage [phone]
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
## 2013-02-26 NOTE — ED Notes (Signed)
Roddie Mc, niece, [phone] cell  [phone] home

## 2013-02-27 NOTE — Progress Notes (Signed)
CSW spoke with the Niece Roddie Mc [phone] concerning Pt d/c to SNF. Pt's niece was also agreeable for placement of Pt at [facility].   CSW will contact facility for d/c planning and proceed with placement as soon as possible.   CSW will continue to follow for d/c planning.   Leron Croak, LCSWA [facility] Emergency Dept.  [phone]

## 2013-02-27 NOTE — ED Notes (Signed)
Patient room in front of nursing station Nurse able to see patient from station. Resting comfortably

## 2013-02-27 NOTE — Progress Notes (Signed)
CSW met with the son at the nurses station to discuss d/c planning to [facility] today. Pt's son is agreeable to placement and will call facility to discuss any extra costs that arise.   CSW will contact facility about acceptance of bed offer.   CSW will also contact niece concerning placement.   Leron Croak, LCSWA [facility] Emergency Dept.  [phone]
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
## 2013-02-27 NOTE — Progress Notes (Signed)
CSW received a call back from Hi-Nella with [facility] and facility stated that Pt does not have a fast track Dx and that we may not be able to receive and auth today, if at all.   Ninetta Lights suggested that CSW send clinicals to [facility] for auth. CSW had already (just) sent clinicals and is awaiting a call back for auth.   CSW will follow for d/c planning.   CSW (or evening CSW) will contact family and have them call [facility] for admission paperwork in the event that Pt auth would be approved and Pt could d/c to facility.   Leron Croak, LCSWA [facility] Emergency Dept.  [phone]

## 2013-02-28 ENCOUNTER — Telehealth: Payer: Self-pay | Admitting: Cardiovascular Disease

## 2013-02-28 NOTE — ED Notes (Signed)
0600  Pt was able to sleep through the entire night without waking.  Pt status has not changed and pt is still calm and still cooperative.  No new needs at this time for the pt.  Will continue to monitor the pt. 

## 2013-02-28 NOTE — Progress Notes (Signed)
CSW was contacted by Wille Celeste at [facility] stating that Pt received an auth for placement and facility would like the family to come to facility for admission paperwork at 1 pm today.   ED CM contacted Dotti Busey [phone] to inform Pt's son to arrive 10 minutes prior in order to begin admission paperwork. CM provided address for facility per Pt son's request.   CSW to follow for d/c planning.   Leron Croak, LCSWA [facility] Emergency Dept.  [phone]

## 2013-02-28 NOTE — ED Notes (Signed)
ptr called to transport

## 2013-02-28 NOTE — Telephone Encounter (Signed)
I spoke to Ms. Warren regarding R/S pt's gen change. Upon speaking to Dr.Croitoru, I discovered that the patient had a CVA the day prior to her scheduled pacemaker generator change. It was decided at that time to wait until the patient was well enough to proceed with the generator change out. I explained to Ms.Broadus John that she should call back within the next few weeks or whenever she feels that her aunt would be able to tolerate the procedure to have it rescheduled. Niece voiced understanding, and states that she will call back. I did explain the importance of having the procedure sooner rather than later due to the patient's dependency on the device.

## 2013-02-28 NOTE — Progress Notes (Signed)
CSW left a message for Caleen [facility]) [phone] concerning if insurance auth approval was received.  CSW spoke with Wille Celeste at [facility] and family will be coming to complete paperwork and Wille Celeste will also f/u to see if insurance Berkley Harvey was approved.   CSW to follow for d/c planning.   Leron Croak, LCSWA [facility] Emergency Dept.  [phone]

## 2013-02-28 NOTE — ED Notes (Signed)
Spoke with janey at [facility] and she advises she has all the papers needed and is ok to send pt by ambulance to facility

## 2013-02-28 NOTE — ED Notes (Signed)
ptr has arrived to transport pt to [facility]

## 2013-02-28 NOTE — ED Notes (Signed)
CSW confirmed with [facility] that they were able to accept pt.  Pt transported via [facility].

## 2013-02-28 NOTE — ED Notes (Signed)
Pt. Is sleeping recliner

## 2013-03-02 ENCOUNTER — Telehealth: Payer: Self-pay | Admitting: Cardiovascular Disease

## 2013-03-02 NOTE — Telephone Encounter (Signed)
She wanted you to know that Grace Silva is now in [place] permanently. She will call later about her appt.

## 2013-03-06 NOTE — Telephone Encounter (Signed)
ok 

## 2013-03-09 ENCOUNTER — Other Ambulatory Visit: Payer: Self-pay

## 2013-03-09 MED ORDER — LEVOTHYROXINE SODIUM 75 MCG PO TABS: 75.0000 ug | ORAL_TABLET | Freq: Every day | ORAL | Status: AC

## 2013-03-09 NOTE — Telephone Encounter (Signed)
Rx was sent to pharmacy electronically via [employer].  

## 2013-03-28 ENCOUNTER — Other Ambulatory Visit: Payer: Self-pay | Admitting: *Deleted

## 2013-03-28 ENCOUNTER — Encounter: Payer: Self-pay | Admitting: Cardiovascular Disease

## 2013-04-04 ENCOUNTER — Other Ambulatory Visit: Payer: Self-pay | Admitting: Cardiovascular Disease

## 2013-04-04 LAB — URINALYSIS, ROUTINE W REFLEX MICROSCOPIC
Bilirubin Urine: NEGATIVE
Glucose, UA: NEGATIVE mg/dL
Leukocytes, UA: NEGATIVE
pH: 5 (ref 5.0–8.0)

## 2013-04-04 LAB — CBC WITH DIFFERENTIAL/PLATELET
Basophils Relative: 1 % (ref 0–1)
Eosinophils Absolute: 0.1 10*3/uL (ref 0.0–0.7)
Hemoglobin: 12.8 g/dL (ref 12.0–15.0)
Lymphs Abs: 1.3 10*3/uL (ref 0.7–4.0)
MCH: 30.5 pg (ref 26.0–34.0)
Monocytes Relative: 7 % (ref 3–12)
Neutro Abs: 4.1 10*3/uL (ref 1.7–7.7)
Neutrophils Relative %: 69 % (ref 43–77)
Platelets: 200 10*3/uL (ref 150–400)
RBC: 4.19 MIL/uL (ref 3.87–5.11)
WBC: 5.9 10*3/uL (ref 4.0–10.5)

## 2013-04-04 LAB — COMPREHENSIVE METABOLIC PANEL
AST: 12 U/L (ref 0–37)
Alkaline Phosphatase: 68 U/L (ref 39–117)
BUN: 21 mg/dL (ref 6–23)
Glucose, Bld: 99 mg/dL (ref 70–99)
Potassium: 3.9 mEq/L (ref 3.5–5.3)
Sodium: 145 mEq/L (ref 135–145)
Total Bilirubin: 0.4 mg/dL (ref 0.3–1.2)

## 2013-04-04 LAB — PROTIME-INR
INR: 2.01 — ABNORMAL HIGH (ref <1.50)
Prothrombin Time: 22.1 seconds — ABNORMAL HIGH (ref 11.6–15.2)

## 2013-04-10 ENCOUNTER — Ambulatory Visit (HOSPITAL_COMMUNITY)
Admission: RE | Admit: 2013-04-10 | Discharge: 2013-04-10 | Disposition: A | Discharge status: Home / Self Care | Payer: Medicare HMO | Source: Ambulatory Visit | Attending: Cardiovascular Disease | Admitting: Cardiovascular Disease

## 2013-04-10 ENCOUNTER — Encounter (HOSPITAL_COMMUNITY)
Admission: RE | Disposition: A | Discharge status: Home / Self Care | Payer: Self-pay | Source: Ambulatory Visit | Attending: Cardiovascular Disease

## 2013-04-10 DIAGNOSIS — E039 Hypothyroidism, unspecified: Secondary | ICD-10-CM | POA: Insufficient documentation

## 2013-04-10 DIAGNOSIS — H548 Legal blindness, as defined in USA: Secondary | ICD-10-CM | POA: Insufficient documentation

## 2013-04-10 DIAGNOSIS — I495 Sick sinus syndrome: Secondary | ICD-10-CM | POA: Insufficient documentation

## 2013-04-10 DIAGNOSIS — I442 Atrioventricular block, complete: Secondary | ICD-10-CM

## 2013-04-10 DIAGNOSIS — Z01812 Encounter for preprocedural laboratory examination: Secondary | ICD-10-CM

## 2013-04-10 DIAGNOSIS — R413 Other amnesia: Secondary | ICD-10-CM | POA: Insufficient documentation

## 2013-04-10 DIAGNOSIS — Z45018 Encounter for adjustment and management of other part of cardiac pacemaker: Secondary | ICD-10-CM | POA: Insufficient documentation

## 2013-04-10 DIAGNOSIS — Z7902 Long term (current) use of antithrombotics/antiplatelets: Secondary | ICD-10-CM | POA: Insufficient documentation

## 2013-04-10 DIAGNOSIS — I10 Essential (primary) hypertension: Secondary | ICD-10-CM | POA: Insufficient documentation

## 2013-04-10 DIAGNOSIS — Z950 Presence of cardiac pacemaker: Secondary | ICD-10-CM

## 2013-04-10 DIAGNOSIS — E785 Hyperlipidemia, unspecified: Secondary | ICD-10-CM | POA: Insufficient documentation

## 2013-04-10 DIAGNOSIS — Z79899 Other long term (current) drug therapy: Secondary | ICD-10-CM | POA: Insufficient documentation

## 2013-04-10 HISTORY — PX: PERMANENT PACEMAKER GENERATOR CHANGE: SHX6022

## 2013-04-10 HISTORY — PX: PACEMAKER GENERATOR CHANGE: SHX5481

## 2013-04-10 LAB — SURGICAL PCR SCREEN: Staphylococcus aureus: POSITIVE — AB

## 2013-04-10 SURGERY — PACEMAKER GENERATOR CHANGE
Anesthesia: LOCAL

## 2013-04-10 MED ORDER — MUPIROCIN 2 % EX OINT
TOPICAL_OINTMENT | Freq: Two times a day (BID) | CUTANEOUS | Status: DC
  Administered 2013-04-10: 1 via NASAL

## 2013-04-10 MED ORDER — ONDANSETRON HCL 4 MG/2ML IJ SOLN: 4.0000 mg | Freq: Four times a day (QID) | INTRAMUSCULAR | Status: DC | PRN

## 2013-04-10 MED ORDER — SODIUM CHLORIDE 0.9 % IV SOLN: INTRAVENOUS | Status: AC

## 2013-04-10 MED ORDER — MUPIROCIN 2 % EX OINT
TOPICAL_OINTMENT | CUTANEOUS | Status: AC
  Administered 2013-04-10: 1 via NASAL
  Filled 2013-04-10: qty 22

## 2013-04-10 MED ORDER — SODIUM CHLORIDE 0.9 % IV SOLN
INTRAVENOUS | Status: DC
  Administered 2013-04-10: 1000 mL via INTRAVENOUS

## 2013-04-10 MED ORDER — FENTANYL CITRATE 0.05 MG/ML IJ SOLN
INTRAMUSCULAR | Status: AC
  Filled 2013-04-10: qty 2

## 2013-04-10 MED ORDER — CEFAZOLIN SODIUM-DEXTROSE 2-3 GM-% IV SOLR
2.0000 g | INTRAVENOUS | Status: DC
  Filled 2013-04-10: qty 50

## 2013-04-10 MED ORDER — SODIUM CHLORIDE 0.9 % IV SOLN: INTRAVENOUS | Status: DC

## 2013-04-10 MED ORDER — CHLORHEXIDINE GLUCONATE 4 % EX LIQD
60.0000 mL | Freq: Once | CUTANEOUS | Status: AC
  Administered 2013-04-10: 4 via TOPICAL

## 2013-04-10 MED ORDER — ACETAMINOPHEN 325 MG PO TABS: 325.0000 mg | ORAL_TABLET | ORAL | Status: DC | PRN

## 2013-04-10 MED ORDER — SODIUM CHLORIDE 0.9 % IR SOLN
80.0000 mg | Status: DC
  Filled 2013-04-10: qty 2

## 2013-04-10 MED ORDER — MIDAZOLAM HCL 5 MG/5ML IJ SOLN
INTRAMUSCULAR | Status: AC
  Filled 2013-04-10: qty 5

## 2013-04-10 NOTE — H&P (Signed)
Date of Initial H&P: 03/28/13  History reviewed, patient examined, no change in status, stable for surgery. Here for elective generator changeout. Has a dual chamber permanent pacemaker for complete heart block, now ERI. This procedure has been fully reviewed with the patient/family POA and written informed consent has been obtained. Thurmon Fair, MD, [facility] [phone] office [phone] pager

## 2013-04-10 NOTE — Op Note (Signed)
Procedure report  Procedure performed:  1. Dua chamber pacemaker generator changeout  2. Light sedation  Reason for procedure:  1. Device generator at elective replacement interval  2.  Procedure performed by:  Thurmon Fair, MD  Complications:  None  Estimated blood loss:  <5 mL  Medications administered during procedure:  Ancef 2 g intravenously,  lidocaine 1% 30 mL locally, fentanyl 50 mcg intravenously, Versed 0.5 mg intravenously Device details:   Personnel officer model number [id number], serial number [id number] Right atrial lead (chronic) Medtronic, model number [id number] serial [id number] (implanted 09/29/2007) Right ventricular lead (chronic)  Medtronic, model number [id number], serial number [id number] (implanted 09/29/2007)  Explanted generator Medtronic EnRhythm,  model number P1501 DR, serial #[id number],  (implanted 09/29/2007) Procedure details:  After the risks and benefits of the procedure were discussed the patient provided informed consent. She was brought to the cardiac catheter lab in the fasting state. The patient was prepped and draped in usual sterile fashion. Local anesthesia with 1% lidocaine was administered to to the left infraclavicular area. A 5-6cm horizontal incision was made parallel with and 2-3 cm caudal to the left clavicle, in the area of an old scar. An older scar was seen closer to the left clavicle. Using minimal electrocautery and mostly sharp and blunt dissection the prepectoral pocket was opened carefully to avoid injury to the loops of chronic leads. Extensive dissection was not necessary. The device was explanted. The pocket was carefully inspected for hemostasis and flushed with copious amounts of antibiotic solution.  The leads were disconnected from the old generator and the ventricular lead was immediately connected to the new generator . Testing of the lead parameters later showed excellent values. The new generator was subsequently  connected to the chronic atrial lead, with appropriate pacing noted.   The entire system was then carefully inserted in the pocket with care been taking that the leads and device assumed a comfortable position without pressure on the incision. Great care was taken that the leads be located deep to the generator. The pocket was then closed in layers using 2 layers of 2-0 Vicryl and one layer of 4-0 Vicryl after which Steri-Strips and a sterile dressing was applied.   At the end of the procedure the following lead parameters were encountered:   Right atrial lead sensed P waves 2.8 mV, impedance 464 ohms, threshold 1.0 at 0.4 ms pulse width.  Right ventricular lead sensed R waves  None,  impedance 472 ohms, threshold 1.0 at 0.4 ms pulse width.

## 2013-04-14 ENCOUNTER — Encounter: Payer: Self-pay | Admitting: Cardiology

## 2013-04-18 ENCOUNTER — Ambulatory Visit (INDEPENDENT_AMBULATORY_CARE_PROVIDER_SITE_OTHER): Payer: Medicare HMO | Admitting: Cardiology

## 2013-04-18 ENCOUNTER — Encounter: Payer: Self-pay | Admitting: Cardiology

## 2013-04-18 ENCOUNTER — Encounter: Payer: Self-pay | Admitting: Cardiovascular Disease

## 2013-04-18 VITALS — BP 152/78 | HR 60 | Ht <= 58 in | Wt 127.6 lb

## 2013-04-18 DIAGNOSIS — Z45018 Encounter for adjustment and management of other part of cardiac pacemaker: Secondary | ICD-10-CM

## 2013-04-18 NOTE — Progress Notes (Signed)
04/18/2013   PCP: Pamelia Hoit, MD   Chief Complaint  Patient presents with  . pacer site check    generator changeout; soreness; occ lightheadedness/dizziness    Primary Cardiologist:Dr. Alanda Amass Dr. Royann Shivers  HPI: 79 year old white with a female that is legally blind with mini strokes in the past and recently hospitalized in July after a fall.  She was diagnosed with encephalopathy. Workup included bloodwork and head/c spine CT as well as multiple MSK xrays. Workup essentially normal apart from ? ? Subacute basal ganglia infarct on head CT as well as chronic microvascular changes. Per niece, Pt was returned back to baseline at [facility] and was discharged. When pt returned home with niece, pt became weakness and slowly fell to ground and became unresponsive again. Pt was then brought to ER for further eval. Per niece, pt lived at home with her sons who do not follow pt closely. Pt has had 2 unwitnessed falls prior to admit in July. She now lives at [facility].   Her pacemaker was set ERI and it was time for generator change which she had electively 04/10/2013.  New Generator Medtronic Adapta model number [id number], serial number [id number]  Right atrial lead (chronic) Medtronic, model number [id number] serial [id number] (implanted 09/29/2007)  Right ventricular lead (chronic) Medtronic, model number [id number], serial number [id number] (implanted 09/29/2007)  Explanted generator Medtronic EnRhythm, model number P1501 DR, serial #[id number],   She is here today for a site check.  She has a pacemaker for sick sinus syndrome and past syncope documented bradycardia. She does not have a history of coronary disease and has not required catheterization. She did have a negative Myoview for ischemia in 2009 and an echo and 2 years ago revealed LV function that was normal.  Unfortunately today we are unable to obtain her medication list from the nursing home. Her blood pressure was up  somewhat but she denied chest pain or shortness of breath we will attempt to recover the med list tomorrow.  No Known Allergies  Current Outpatient Prescriptions  Medication Sig Dispense Refill  . captopril (CAPOTEN) 100 MG tablet Take 100 mg by mouth daily.       . clopidogrel (PLAVIX) 75 MG tablet Take 1 tablet (75 mg total) by mouth daily with breakfast.  30 tablet  1  . levothyroxine (SYNTHROID, LEVOTHROID) 75 MCG tablet Take 1 tablet (75 mcg total) by mouth daily before breakfast.  30 tablet  6  . metoprolol succinate (TOPROL-XL) 50 MG 24 hr tablet Take 50 mg by mouth daily. Take with or immediately following a meal.      . pravastatin (PRAVACHOL) 20 MG tablet Take 20 mg by mouth daily.      . Vitamin D, Ergocalciferol, (DRISDOL) 50000 UNITS CAPS capsule Take 50,000 Units by mouth every 7 (seven) days.      [facility] 20 MG TABS tablet Take 20 mg by mouth daily.       No current facility-administered medications for this visit.    Past Medical History  Diagnosis Date  . Thyroid disease   . Legally blind   . Hypertension   . Pacemaker 09/29/07/04/10/13    medtronic/medtronic adapta  . Transient ischemic attack (TIA)   . Hyperlipemia   . Dementia   . SSS (sick sinus syndrome) 2009    rec'd pacer (bradycardia/syncope)  . H/O cardiovascular stress test 07/12/2008    Lexiscan myoview EF 68%, no ischemia  . H/O echocardiogram 02/20/2013  EF 55-60%; aortic scerosis    Past Surgical History  Procedure Laterality Date  . Pacemaker placement  09/29/2007    medtronic enrhythm  . Permanent pacemaker generator change  04/10/2013    medtronic adapta- due to ERI    ZOX:WRUEAVW:UJ colds or fevers, no weight changes Skin:no rashes or ulcers Neuro:no syncope, no lightheadedness  PHYSICAL EXAM BP 152/78  Pulse 60  Ht 4\' 10"  (1.473 m)  Wt 127 lb 9.6 oz (57.879 kg)  BMI 26.68 kg/m2 General:Pleasant affect, NAD Skin:Warm and dry, brisk capillary refill HEENT:normocephalic, sclera  clear, mucus membranes moist Heart:S1S2 RRR without murmur, gallup, rub or click Lungs:clear without rales, rhonchi, or wheezes Pacer site- I removed steri strips and cleaned area with Betadine.  Site well approximated and healed.  steristrips placed which if they do not fall off may be removed in 4 days.   ASSESSMENT AND PLAN Elective replacement indicated for pacemaker S/p pacemaker- stable site, follow up with Dr. Royann Shivers in 4-5 weeks. Please note Her BP was elevated and we did not have correct form from SNF to let us know what pt was taking.  They could not find her chart.  We will try again tomorrow.

## 2013-04-18 NOTE — Patient Instructions (Signed)
May remove steristips in 4 days.  Follow up with Dr. Royann Shivers in 4 - 5 weeks  Call if any problems

## 2013-04-18 NOTE — Assessment & Plan Note (Signed)
S/p pacemaker- stable site, follow up with Dr. Royann Shivers in 4-5 weeks. Please note Her BP was elevated and we did not have correct form from SNF to let us know what pt was taking.  They could not find her chart.  We will try again tomorrow.

## 2013-04-21 ENCOUNTER — Telehealth: Payer: Self-pay | Admitting: Cardiology

## 2013-04-21 NOTE — Telephone Encounter (Signed)
Returned call to Cl[place], NP at [facility].  Stated she saw on pt's discharge information that there was a question as to why pt was on Xarelto and Plavix.  Stated pt is being treated for DVT, but she dc'd Xarelto.  Stated pt had been on it for ~ 1.5 months.  If pt needs to be restarted on Xarelto, let her know and she can give the order on Monday as she has left for the weekend.  Informed NP will be notified and verbalized understanding.  Message forwarded to L. Annie Paras, NP for further instructions.

## 2013-04-21 NOTE — Telephone Encounter (Signed)
Please call-regarding her Xarelto.

## 2013-04-22 NOTE — Telephone Encounter (Signed)
Please let RN know that we had no record of DVT and we were not stopping Xarelto only asking the question.   She would need to ask MD who ordered the Xarelto whether to stop.  But if needs to be on Xarelto then would stop Plavix.  Where is DVT?

## 2013-04-24 NOTE — Telephone Encounter (Signed)
Call to NP and informed per L. Annie Paras, NP.  Stated she is not at the office yet and will have to confirm location of DVT when she goes in today.  Stated the pharmacist did flag the Xarelto and Plavix when she dc'd the Xarelto.  Will assess pt when she goes in today and discuss w/ MD.

## 2013-05-30 ENCOUNTER — Encounter: Payer: Self-pay | Admitting: Cardiovascular Disease

## 2013-05-30 ENCOUNTER — Ambulatory Visit (INDEPENDENT_AMBULATORY_CARE_PROVIDER_SITE_OTHER): Payer: Medicare HMO | Admitting: Cardiovascular Disease

## 2013-05-30 VITALS — BP 150/70 | HR 60 | Resp 16 | Ht 59.0 in | Wt 128.0 lb

## 2013-05-30 DIAGNOSIS — Z950 Presence of cardiac pacemaker: Secondary | ICD-10-CM

## 2013-05-30 DIAGNOSIS — I10 Essential (primary) hypertension: Secondary | ICD-10-CM

## 2013-05-30 DIAGNOSIS — I442 Atrioventricular block, complete: Secondary | ICD-10-CM

## 2013-05-30 LAB — PACEMAKER DEVICE OBSERVATION

## 2013-05-30 NOTE — Patient Instructions (Addendum)
Remote monitoring is used to monitor your pacemaker from home. This monitoring reduces the number of office visits required to check your device to one time per year. It allows [place] to keep an eye on the functioning of your device to ensure it is working properly. You are scheduled for a device check from home on 08-31-2013. You may send your transmission at any time that day. If you have a wireless device, the transmission will be sent automatically. After your physician reviews your transmission, you will receive a postcard with your next transmission date.  If this transmission is done from the nursing facility, please plug the phone cord into the fax machine to send the transmission.  Please call the number listed above if there are any questions.  You will receive a letter within 7-10 days after the transmission is sent and reviewed. Please call if the letter is not received by then.   Your physician recommends that you schedule a follow-up appointment in: 12 months

## 2013-06-04 ENCOUNTER — Encounter: Payer: Self-pay | Admitting: Cardiovascular Disease

## 2013-06-04 NOTE — Assessment & Plan Note (Addendum)
6 weeks Status post generator change out with well-healed site. Her current device is a dual chamber Medtronic adapta. Since this generator was implanted there has been 65% atrial pacing and 100% ventricular pacing. Travel is difficult for Grace Silva. she is a good candidate for remote pacemaker followup via the care link system and we'll see her in the office every 12 months

## 2013-06-04 NOTE — Assessment & Plan Note (Signed)
Blood pressure is minimally elevated. I worry about excessive blood pressure control in this nonagenarian and did not make any changes to her medications today

## 2013-06-04 NOTE — Progress Notes (Signed)
Patient ID: Grace Silva, female   DOB: [birth year], 79 y.o.   MRN: [id number]      Reason for office visit Complete heart block, pacemaker  Grace Silva seems to be substantially improved. She has memory problems and is disoriented but seems to be in a good mood accompanied by her niece today. She now is living full time at [facility] home. She seems to enjoy being there.  It has been roughly 6 weeks since she underwent an uncomplicated change out over a dual chamber permanent pacemaker. She has complete heart block and is pacemaker dependent. The device is functioning normally by a comprehensive check in the office today. She is very sedentary. Lead parameters are good. There has been no detected atrial fibrillation on her device.  Her previous evaluation she does not have any signs of coronary disease or significant valvular problems.  When we had initially scheduled her for a pacemaker generator change out in August and she had a new basal ganglia stroke. At that time she was started on Plavix. At some point this was switched to Xarelto reportedly for a deep venous thrombosis. I am unable to find a record confirming the DVT. She has not had any bleeding problems or any neurological events.   No Known Allergies  Current Outpatient Prescriptions  Medication Sig Dispense Refill  . acetaminophen (TYLENOL) 650 MG CR tablet Take 650 mg by mouth every 8 (eight) hours as needed for pain.      . captopril (CAPOTEN) 100 MG tablet Take 100 mg by mouth daily.       Marland Kitchen levothyroxine (SYNTHROID, LEVOTHROID) 75 MCG tablet Take 1 tablet (75 mcg total) by mouth daily before breakfast.  30 tablet  6  . metoprolol succinate (TOPROL-XL) 50 MG 24 hr tablet Take 50 mg by mouth daily. Take with or immediately following a meal.      . polyethylene glycol (MIRALAX / GLYCOLAX) packet Take 17 g by mouth daily.      . pravastatin (PRAVACHOL) 20 MG tablet Take 20 mg by mouth daily.      . Vitamin D,  Ergocalciferol, (DRISDOL) 50000 UNITS CAPS capsule Take 50,000 Units by mouth every 7 (seven) days.      Carlena Hurl 20 MG TABS tablet Take 20 mg by mouth daily.       No current facility-administered medications for this visit.    Past Medical History  Diagnosis Date  . Thyroid disease   . Legally blind   . Hypertension   . Pacemaker 09/29/07/04/10/13    medtronic/medtronic adapta  . Transient ischemic attack (TIA)   . Hyperlipemia   . Dementia   . SSS (sick sinus syndrome) 2009    rec'd pacer (bradycardia/syncope)  . H/O cardiovascular stress test 07/12/2008    Lexiscan myoview EF 68%, no ischemia  . H/O echocardiogram 02/20/2013    EF 55-60%; aortic scerosis    Past Surgical History  Procedure Laterality Date  . Pacemaker placement  09/29/2007    medtronic enrhythm  . Permanent pacemaker generator change  04/10/2013    medtronic adapta- due to ERI    Family History  Problem Relation Age of Onset  . Cancer Mother   . Pneumonia Brother     History   Social History  . Marital Status: Widowed    Spouse Name: N/A    Number of Children: N/A  . Years of Education: N/A   Occupational History  . Not on file.   Social History Main  Topics  . Smoking status: Never Smoker   . Smokeless tobacco: Never Used  . Alcohol Use: No  . Drug Use: No  . Sexual Activity: Not on file   Other Topics Concern  . Not on file   Social History Narrative  . No narrative on file    Review of systems: Only limited review of systems can be obtained. As much as I can tell she does not have any dyspnea, shortness of breath, palpitations, dizziness, syncope or edema  PHYSICAL EXAM BP 150/70  Pulse 60  Resp 16  Ht 4\' 11"  (1.499 m)  Wt 128 lb (58.06 kg)  BMI 25.84 kg/m2  General: Alert, oriented x3, no distress Head: no evidence of trauma, PERRL, EOMI, no exophtalmos or lid lag, no myxedema, no xanthelasma; normal ears, nose and oropharynx Neck: normal jugular venous pulsations and no  hepatojugular reflux; brisk carotid pulses without delay and no carotid bruits Chest: clear to auscultation, no signs of consolidation by percussion or palpation, normal fremitus, symmetrical and full respiratory excursions; the subclavian pacemaker site appears healthy Cardiovascular: normal position and quality of the apical impulse, regular rhythm, normal first and second heart sounds, no murmurs, rubs or gallops Abdomen: no tenderness or distention, no masses by palpation, no abnormal pulsatility or arterial bruits, normal bowel sounds, no hepatosplenomegaly Extremities: no clubbing, cyanosis or edema; 2+ radial, ulnar and brachial pulses bilaterally; 2+ right femoral, posterior tibial and dorsalis pedis pulses; 2+ left femoral, posterior tibial and dorsalis pedis pulses; no subclavian or femoral bruits Neurological: grossly nonfocal  Lipid Panel     Component Value Date/Time   CHOL 196 02/21/2013 0555   TRIG 137 02/21/2013 0555   HDL 31* 02/21/2013 0555   CHOLHDL 6.3 02/21/2013 0555   VLDL 27 02/21/2013 0555   LDLCALC 138* 02/21/2013 0555    BMET    Component Value Date/Time   NA 145 04/04/2013 1120   K 3.9 04/04/2013 1120   CL 108 04/04/2013 1120   CO2 29 04/04/2013 1120   GLUCOSE 99 04/04/2013 1120   BUN 21 04/04/2013 1120   CREATININE 0.95 04/04/2013 1120   CREATININE 0.94 02/26/2013 1147   CALCIUM 8.9 04/04/2013 1120   GFRNONAA 51* 02/26/2013 1147   GFRAA 60* 02/26/2013 1147     ASSESSMENT AND PLAN Pacemaker 6 weeks Status post generator change out with well-healed site. Her current device is a dual chamber Medtronic adapta. Since this generator was implanted there has been 65% atrial pacing and 100% ventricular pacing. Travel is difficult for Grace Silva. she is a good candidate for remote pacemaker followup via the care link system and we'll see her in the office every 12 months  Complete heart block Pacemaker dependent  HTN (hypertension) Blood pressure is minimally elevated. I worry about  excessive blood pressure control in this nonagenarian and did not make any changes to her medications today  No orders of the defined types were placed in this encounter.   Meds ordered this encounter  Medications  . polyethylene glycol (MIRALAX / GLYCOLAX) packet    Sig: Take 17 g by mouth daily.  Marland Kitchen acetaminophen (TYLENOL) 650 MG CR tablet    Sig: Take 650 mg by mouth every 8 (eight) hours as needed for pain.    Junious Silk, MD, [facility] [phone] office [phone] pager

## 2013-06-04 NOTE — Assessment & Plan Note (Signed)
Pacemaker dependent 

## 2013-08-31 ENCOUNTER — Encounter: Payer: Medicare HMO | Admitting: *Deleted

## 2013-09-08 ENCOUNTER — Encounter: Payer: Self-pay | Admitting: *Deleted

## 2014-01-02 ENCOUNTER — Encounter: Payer: Self-pay | Admitting: Cardiology

## 2014-01-05 ENCOUNTER — Telehealth: Payer: Self-pay | Admitting: *Deleted

## 2014-01-05 NOTE — Telephone Encounter (Signed)
Message copied by Sebastian Ache on Fri Jan 05, 2014  8:30 AM ------      Message from: Hilma Favors D      Created: Wed Jan 03, 2014  2:32 PM      Contact: [phone]       Larita Fife from [facility] is asking for a remote pacer check set up for Grace Silva . Not sure if I  schedule those or  not ------

## 2014-01-05 NOTE — Telephone Encounter (Signed)
Grace Silva informed to send remote today. Grace Silva was encouraged to call to verify that information was received.

## 2014-02-07 ENCOUNTER — Encounter: Payer: Self-pay | Admitting: *Deleted

## 2014-02-08 ENCOUNTER — Ambulatory Visit (INDEPENDENT_AMBULATORY_CARE_PROVIDER_SITE_OTHER): Payer: Medicare HMO | Admitting: *Deleted

## 2014-02-08 ENCOUNTER — Encounter: Payer: Self-pay | Admitting: Internal Medicine

## 2014-02-08 DIAGNOSIS — Z950 Presence of cardiac pacemaker: Secondary | ICD-10-CM

## 2014-02-08 DIAGNOSIS — I442 Atrioventricular block, complete: Secondary | ICD-10-CM

## 2014-02-08 NOTE — Progress Notes (Signed)
Remote pacemaker transmission.   

## 2014-02-10 LAB — MDC_IDC_ENUM_SESS_TYPE_REMOTE
Battery Impedance: 103 Ohm
Battery Voltage: 2.79 V
Brady Statistic AP VP Percent: 70 %
Brady Statistic AP VS Percent: 0 %
Brady Statistic AS VP Percent: 30 %
Brady Statistic AS VS Percent: 0 %
Date Time Interrogation Session: 20150709165801
Lead Channel Impedance Value: 576 Ohm
Lead Channel Pacing Threshold Amplitude: 0.5 V
Lead Channel Pacing Threshold Pulse Width: 0.4 ms
Lead Channel Sensing Intrinsic Amplitude: 1.4 mV
Lead Channel Setting Pacing Amplitude: 2 V
Lead Channel Setting Pacing Pulse Width: 0.4 ms
MDC IDC MSMT BATTERY REMAINING LONGEVITY: 135 mo
MDC IDC MSMT LEADCHNL RV IMPEDANCE VALUE: 693 Ohm
MDC IDC MSMT LEADCHNL RV PACING THRESHOLD AMPLITUDE: 0.75 V
MDC IDC MSMT LEADCHNL RV PACING THRESHOLD PULSEWIDTH: 0.4 ms
MDC IDC SET LEADCHNL RA PACING AMPLITUDE: 1.5 V
MDC IDC SET LEADCHNL RV SENSING SENSITIVITY: 5.6 mV

## 2014-02-20 ENCOUNTER — Encounter: Payer: Self-pay | Admitting: Cardiology

## 2014-02-22 ENCOUNTER — Encounter: Payer: Self-pay | Admitting: Cardiovascular Disease

## 2014-06-05 ENCOUNTER — Encounter: Payer: Self-pay | Admitting: Cardiovascular Disease

## 2014-07-12 ENCOUNTER — Encounter (HOSPITAL_COMMUNITY): Payer: Self-pay | Admitting: Cardiovascular Disease

## 2014-08-14 ENCOUNTER — Encounter: Payer: Medicare Other | Admitting: Cardiovascular Disease

## 2014-09-11 ENCOUNTER — Ambulatory Visit (INDEPENDENT_AMBULATORY_CARE_PROVIDER_SITE_OTHER): Payer: Medicare Other | Admitting: Cardiovascular Disease

## 2014-09-11 ENCOUNTER — Encounter: Payer: Self-pay | Admitting: Cardiovascular Disease

## 2014-09-11 VITALS — BP 143/85 | HR 59 | Resp 16 | Ht 59.0 in | Wt 129.0 lb

## 2014-09-11 DIAGNOSIS — I442 Atrioventricular block, complete: Secondary | ICD-10-CM

## 2014-09-11 DIAGNOSIS — Z950 Presence of cardiac pacemaker: Secondary | ICD-10-CM

## 2014-09-11 DIAGNOSIS — I10 Essential (primary) hypertension: Secondary | ICD-10-CM

## 2014-09-11 LAB — MDC_IDC_ENUM_SESS_TYPE_REMOTE
Battery Remaining Longevity: 129 mo
Battery Voltage: 2.79 V
Brady Statistic AP VP Percent: 70 %
Brady Statistic AP VS Percent: 0 %
Brady Statistic AS VP Percent: 30 %
Brady Statistic AS VS Percent: 0 %
Date Time Interrogation Session: 20160209204907
Lead Channel Impedance Value: 637 Ohm
Lead Channel Impedance Value: 693 Ohm
Lead Channel Pacing Threshold Pulse Width: 0.4 ms
Lead Channel Pacing Threshold Pulse Width: 0.4 ms
Lead Channel Sensing Intrinsic Amplitude: 1 mV
Lead Channel Setting Pacing Pulse Width: 0.4 ms
Lead Channel Setting Sensing Sensitivity: 5.6 mV
MDC IDC MSMT BATTERY IMPEDANCE: 127 Ohm
MDC IDC MSMT LEADCHNL RA PACING THRESHOLD AMPLITUDE: 0.375 V
MDC IDC MSMT LEADCHNL RV PACING THRESHOLD AMPLITUDE: 0.75 V
MDC IDC SET LEADCHNL RA PACING AMPLITUDE: 1.5 V
MDC IDC SET LEADCHNL RV PACING AMPLITUDE: 2 V

## 2014-09-11 NOTE — Patient Instructions (Signed)
Remote monitoring is used to monitor your pacemaker from home. This monitoring reduces the number of office visits required to check your device to one time per year. It allows us to keep an eye on the functioning of your device to ensure it is working properly. You are scheduled for a device check from home on 12-11-2014. You may send your transmission at any time that day. If you have a wireless device, the transmission will be sent automatically. After your physician reviews your transmission, you will receive a postcard with your next transmission date.  Your physician recommends that you schedule a follow-up appointment in: 12 months with Dr.Croitoru   

## 2014-09-12 ENCOUNTER — Encounter: Payer: Self-pay | Admitting: Cardiovascular Disease

## 2014-09-12 NOTE — Progress Notes (Signed)
Patient ID: Grace [facility], female   DOB: Oct 14, 1918, 81 y.o.   MRN: [id number]     Reason for office visit [facility], pacemaker check  Grace Silva's memory continues to deteriorate, but she has no physical complaints. She now resides at [facility] [place].  She is pacemaker dependent due to complete heart block. Her pacemaker leads were implanted in 2009.She underwent a pacemaker generator changeout in 2014 (Medtronic Adapta dual chamber). She had a basal ganglia stroke in 2014 as well, presenting with a fall and protracted encephalopathic state. She has no significant structural heart disease by previous workup.  She feels well and seems happy. She does not remember who I am. Her daughter answers many of my questions.  Normal pacemaker function by comprehensive check in the office today. SHe has 100% V pacing and also a high prevalence of  A pacing.   No Known Allergies  Current Outpatient Prescriptions  Medication Sig Dispense Refill  . acetaminophen (TYLENOL) 650 MG CR tablet Take 650 mg by mouth every 8 (eight) hours as needed for pain.    Marland Kitchen aspirin 81 MG tablet Take 81 mg by mouth daily.    . captopril (CAPOTEN) 100 MG tablet Take 50 mg by mouth 2 (two) times daily.     Marland Kitchen levothyroxine (SYNTHROID, LEVOTHROID) 75 MCG tablet Take 1 tablet (75 mcg total) by mouth daily before breakfast. 30 tablet 6  . metoprolol succinate (TOPROL-XL) 50 MG 24 hr tablet Take 50 mg by mouth daily. Take with or immediately following a meal.    . polyethylene glycol (MIRALAX / GLYCOLAX) packet Take 17 g by mouth daily.    . rosuvastatin (CRESTOR) 5 MG tablet Take 5 mg by mouth daily.    . Vitamin D, Ergocalciferol, (DRISDOL) 50000 UNITS CAPS capsule Take 50,000 Units by mouth every 7 (seven) days.     No current facility-administered medications for this visit.    Past Medical History  Diagnosis Date  . Thyroid disease   . Legally blind   . Hypertension   . Pacemaker 09/29/07/04/10/13   medtronic/medtronic adapta  . Transient ischemic attack (TIA)   . Hyperlipemia   . Dementia   . SSS (sick sinus syndrome) 2009    rec'd pacer (bradycardia/syncope)  . H/O cardiovascular stress test 07/12/2008    Lexiscan myoview EF 68%, no ischemia  . H/O echocardiogram 02/20/2013    EF 55-60%; aortic scerosis    Past Surgical History  Procedure Laterality Date  . Pacemaker placement  09/29/2007    medtronic enrhythm  . Permanent pacemaker generator change  04/10/2013    medtronic adapta- due to ERI  . Pacemaker generator change N/A 04/10/2013    Procedure: PACEMAKER GENERATOR CHANGE;  Surgeon: Thurmon Fair, MD;  Location: [facility] CATH LAB;  Service: Cardiovascular;  Laterality: N/A;    Family History  Problem Relation Age of Onset  . Cancer Mother   . Pneumonia Brother     History   Social History  . Marital Status: Widowed    Spouse Name: N/A  . Number of Children: N/A  . Years of Education: N/A   Occupational History  . Not on file.   Social History Main Topics  . Smoking status: Never Smoker   . Smokeless tobacco: Never Used  . Alcohol Use: No  . Drug Use: No  . Sexual Activity: Not on file   Other Topics Concern  . Not on file   Social History Narrative    Review of systems: Limited  due to memory. The patient specifically denies any chest pain at rest exertion, dyspnea at rest or with exertion, orthopnea, paroxysmal nocturnal dyspnea, syncope, palpitations, focal neurological deficits, intermittent claudication, lower extremity edema, unexplained weight gain, cough, hemoptysis or wheezing.   PHYSICAL EXAM BP 143/85 mmHg  Pulse 59  Resp 16  Ht 4\' 11"  (1.499 m)  Wt 58.514 kg (129 lb)  BMI 26.04 kg/m2 General: Alert, oriented x3, no distress Head: no evidence of trauma, PERRL, EOMI, no exophtalmos or lid lag, no myxedema, no xanthelasma; normal ears, nose and oropharynx Neck: normal jugular venous pulsations and no hepatojugular reflux; brisk carotid pulses  without delay and no carotid bruits Chest: clear to auscultation, no signs of consolidation by percussion or palpation, normal fremitus, symmetrical and full respiratory excursions; the subclavian pacemaker site appears healthy Cardiovascular: normal position and quality of the apical impulse, regular rhythm, normal first and second heart sounds, no murmurs, rubs or gallops Abdomen: no tenderness or distention, no masses by palpation, no abnormal pulsatility or arterial bruits, normal bowel sounds, no hepatosplenomegaly Extremities: no clubbing, cyanosis or edema; 2+ radial, ulnar and brachial pulses bilaterally; 2+ right femoral, posterior tibial and dorsalis pedis pulses; 2+ left femoral, posterior tibial and dorsalis pedis pulses; no subclavian or femoral bruits Neurological: grossly nonfocal   EKG: A V paced  Lipid Panel     Component Value Date/Time   CHOL 196 02/21/2013 0555   TRIG 137 02/21/2013 0555   HDL 31* 02/21/2013 0555   CHOLHDL 6.3 02/21/2013 0555   VLDL 27 02/21/2013 0555   LDLCALC 138* 02/21/2013 0555    BMET    Component Value Date/Time   NA 145 04/04/2013 1120   K 3.9 04/04/2013 1120   CL 108 04/04/2013 1120   CO2 29 04/04/2013 1120   GLUCOSE 99 04/04/2013 1120   BUN 21 04/04/2013 1120   CREATININE 0.95 04/04/2013 1120   CREATININE 0.94 02/26/2013 1147   CALCIUM 8.9 04/04/2013 1120   GFRNONAA 51* 02/26/2013 1147   GFRAA 60* 02/26/2013 1147     ASSESSMENT AND PLAN  Pacemaker dependent patient with complete heart block and a normally functioning dual chamber pacemaker. Continue remote checks every 3 months.   Borderline elevated BP does not warrant therapy.  Recurrent stroke is a concern, but aspirin alone seems appropriate in this nonagenarian with dementia.  Patient Instructions  Remote monitoring is used to monitor your pacemaker from home. This monitoring reduces the number of office visits required to check your device to one time per year. It  allows us to keep an eye on the functioning of your device to ensure it is working properly. You are scheduled for a device check from home on 12-11-2014. You may send your transmission at any time that day. If you have a wireless device, the transmission will be sent automatically. After your physician reviews your transmission, you will receive a postcard with your next transmission date.  Your physician recommends that you schedule a follow-up appointment in: 12 months with Dr.Tripton Ned       Meds ordered this encounter  Medications  . aspirin 81 MG tablet    Sig: Take 81 mg by mouth daily.  . rosuvastatin (CRESTOR) 5 MG tablet    Sig: Take 5 mg by mouth daily.    Junious SilkROITORU,Kaley Jutras  Jakin Pavao, MD, [facility] [phone] office [phone] pager

## 2014-09-18 ENCOUNTER — Encounter: Payer: Self-pay | Admitting: Cardiovascular Disease

## 2014-12-11 ENCOUNTER — Encounter: Payer: Medicare Other | Admitting: *Deleted

## 2014-12-11 ENCOUNTER — Telehealth: Payer: Self-pay | Admitting: Cardiology

## 2014-12-11 NOTE — Telephone Encounter (Signed)
Confirmed remote transmission w/ Mary at nursing home.

## 2014-12-14 ENCOUNTER — Encounter: Payer: Self-pay | Admitting: Cardiology

## 2015-05-28 ENCOUNTER — Encounter: Payer: Medicare Other | Admitting: Cardiovascular Disease

## 2015-06-12 ENCOUNTER — Ambulatory Visit (INDEPENDENT_AMBULATORY_CARE_PROVIDER_SITE_OTHER): Payer: Medicare Other | Admitting: Cardiovascular Disease

## 2015-06-12 VITALS — BP 158/70 | HR 60 | Resp 20

## 2015-06-12 DIAGNOSIS — Z950 Presence of cardiac pacemaker: Secondary | ICD-10-CM

## 2015-06-12 DIAGNOSIS — I442 Atrioventricular block, complete: Secondary | ICD-10-CM | POA: Diagnosis not present

## 2015-06-12 NOTE — Patient Instructions (Addendum)
Remote monitoring is used to monitor your pacemaker from home. This monitoring reduces the number of office visits required to check your device to one time per year. It allows us to keep an eye on the functioning of your device to ensure it is working properly. You are scheduled for a device check from home on 09/11/2015. You may send your transmission at any time that day. If you have a wireless device, the transmission will be sent automatically. After your physician reviews your transmission, you will receive a postcard with your next transmission date.  Your physician wants you to follow-up in: ONE YEAR WITH DR CROITORU You will receive a reminder letter in the mail two months in advance. If you don't receive a letter, please call our office to schedule the follow-up appointment.  

## 2015-06-14 ENCOUNTER — Encounter: Payer: Self-pay | Admitting: Cardiovascular Disease

## 2015-06-14 NOTE — Progress Notes (Signed)
Patient ID: Grace Silva, female   DOB: Silva, 81 y.o.   MRN: [id number]     Cardiology Office Note   Date:  06/14/2015   ID:  Grace Silva, MRN [id number]  PCP:  Pamelia HoitWILSON,FRED HENRY, MD  Cardiologist:   Thurmon FairROITORU,Kaley Jutras, MD   Chief complaint: CHB, pacemaker check.     History of Present Illness: Grace Silva is a 81 y.o. female who presents for  Pacemaker check. She has complete heart block and is pacemaker dependent. She has just turned 81 years old.  She is accompanied by her niece, who has medical POA. She lives in a retirement facility in [place]. She has not had a pacemaker check since February.  Her memory  Continues to deteriorate slowly.   Interrogation of her pacemaker shows normal function. Her dual-chamber Medtronic Adapta device was implanted in 2014 and still has 9.5 years of estimated generator longevity. Lead parameters are normal. There 71% atrial pacing and 100% ventricular pacing. The presenting rhythm today was AV sequentially paced. No episodes of atrial mode switches or high ventricular rate have been recorded. She has occasional native atrial beats. Uncertain if sinus or PACs.  By her previous evaluation she does not have any signs of coronary disease or significant valvular problems.  In 2014 she had a basal ganglia stroke. She has not had any bleeding problems or any neurological events since then. Currently on ASA.  Past Medical History  Diagnosis Date  . Thyroid disease   . Legally blind   . Hypertension   . Pacemaker 09/29/07/04/10/13    medtronic/medtronic adapta  . Transient ischemic attack (TIA)   . Hyperlipemia   . Dementia   . SSS (sick sinus syndrome) (HCC) 2009    rec'd pacer (bradycardia/syncope)  . H/O cardiovascular stress test 07/12/2008    Lexiscan myoview EF 68%, no ischemia  . H/O echocardiogram 02/20/2013    EF 55-60%; aortic scerosis    Past Surgical History  Procedure Laterality Date  . Pacemaker  placement  09/29/2007    medtronic enrhythm  . Permanent pacemaker generator change  04/10/2013    medtronic adapta- due to ERI  . Pacemaker generator change N/A 04/10/2013    Procedure: PACEMAKER GENERATOR CHANGE;  Surgeon: Thurmon FairMihai Yarithza Mink, MD;  Location: [facility];  Service: Cardiovascular;  Laterality: N/A;     Current Outpatient Prescriptions  Medication Sig Dispense Refill  . acetaminophen (TYLENOL) 325 MG tablet Take 650 mg by mouth every 6 (six) hours as needed for mild pain.    Marland Kitchen. acetaminophen (TYLENOL) 500 MG tablet Take 500 mg by mouth 2 (two) times daily as needed for mild pain.    Marland Kitchen. aspirin 81 MG tablet Take 81 mg by mouth daily.    . captopril (CAPOTEN) 50 MG tablet Take 1 tablet by mouth every 6 (six) hours.    . fluticasone (FLONASE) 50 MCG/ACT nasal spray Place 1 spray into both nostrils daily.    Marland Kitchen. gabapentin (NEURONTIN) 100 MG capsule Take 200 mg by mouth daily.    Marland Kitchen. levothyroxine (SYNTHROID, LEVOTHROID) 75 MCG tablet Take 1 tablet (75 mcg total) by mouth daily before breakfast. 30 tablet 6  . loratadine (CLARITIN) 10 MG tablet Take 10 mg by mouth daily.    . Melatonin 1 MG TABS Take 2 tablets by mouth at bedtime.    . metoprolol succinate (TOPROL-XL) 50 MG 24 hr tablet Take 50 mg by mouth daily. Take with or immediately following a meal.    .  Polyethyl Glycol-Propyl Glycol (SYSTANE OP) Place 1 drop into both eyes 3 (three) times daily.    . polyethylene glycol (MIRALAX / GLYCOLAX) packet Take 17 g by mouth daily.    . rosuvastatin (CRESTOR) 5 MG tablet Take 5 mg by mouth daily.    . vitamin E 400 UNIT capsule Take 400 Units by mouth daily.     No current facility-administered medications for this visit.    Allergies:   Review of patient's allergies indicates no known allergies.    Social History:  The patient  reports that she has never smoked. She has never used smokeless tobacco. She reports that she does not drink alcohol or use illicit drugs.   Family History:   The patient's family history includes Cancer in her mother; Pneumonia in her brother.    ROS:  Please see the history of present illness.    Otherwise, review of systems positive for none.   She does not really engage much in conversation today. All other systems are reviewed and negative.    PHYSICAL EXAM: VS:  BP 158/70 mmHg  Pulse 60  Resp 20 , BMI There is no weight on file to calculate BMI.  General: Alert, oriented x3, no distress Head: no evidence of trauma, PERRL, EOMI, no exophtalmos or lid lag, no myxedema, no xanthelasma; normal ears, nose and oropharynx Neck: normal jugular venous pulsations and no hepatojugular reflux; brisk carotid pulses without delay and no carotid bruits Chest: clear to auscultation, no signs of consolidation by percussion or palpation, normal fremitus, symmetrical and full respiratory excursions Cardiovascular: normal position and quality of the apical impulse, regular rhythm, normal first and second heart sounds, no murmurs, rubs or gallops Abdomen: no tenderness or distention, no masses by palpation, no abnormal pulsatility or arterial bruits, normal bowel sounds, no hepatosplenomegaly Extremities: no clubbing, cyanosis or edema; 2+ radial, ulnar and brachial pulses bilaterally; 2+ right femoral, posterior tibial and dorsalis pedis pulses; 2+ left femoral, posterior tibial and dorsalis pedis pulses; no subclavian or femoral bruits Neurological: grossly nonfocal Psych: euthymic mood, full affect   EKG:  EKG is ordered today. The ekg ordered today demonstrates AV sequential pacing   Recent Labs: No results found for requested labs within last 365 days.    Lipid Panel    Component Value Date/Time   CHOL 196 02/21/2013 0555   TRIG 137 02/21/2013 0555   HDL 31* 02/21/2013 0555   CHOLHDL 6.3 02/21/2013 0555   VLDL 27 02/21/2013 0555   LDLCALC 138* 02/21/2013 0555      Wt Readings from Last 3 Encounters:  09/11/14 129 lb (58.514 kg)    05/30/13 128 lb (58.06 kg)  04/18/13 127 lb 9.6 oz (57.879 kg)      ASSESSMENT AND PLAN:  Dot is pacemaker dependent due to complete heart block. Device function is normal. Her pacemaker should be evaluated every 3 months remotely. Her niece will pick up the remote [employer] device and take it to her nursing facility on a three-month basis.    Current medicines are reviewed at length with the patient today.  The patient does not have concerns regarding medicines.  The following changes have been made:  no change  Labs/ tests ordered today include:  Orders Placed This Encounter  Procedures  . EKG 12-Lead     Patient Instructions  Remote monitoring is used to monitor your pacemaker from home. This monitoring reduces the number of office visits required to check your device to one time per  year. It allows [place] to keep an eye on the functioning of your device to ensure it is working properly. You are scheduled for a device check from home on 09/11/2015. You may send your transmission at any time that day. If you have a wireless device, the transmission will be sent automatically. After your physician reviews your transmission, you will receive a postcard with your next transmission date.  Your physician wants you to follow-up in: ONE YEAR WITH DR Royann Shivers You will receive a reminder letter in the mail two months in advance. If you don't receive a letter, please call our office to schedule the follow-up appointment.      Joie Bimler, MD  06/14/2015 9:08 PM    Thurmon Fair, MD, [facility] [phone] office [phone] pager

## 2015-07-02 LAB — CUP PACEART INCLINIC DEVICE CHECK
Battery Remaining Longevity: 115 mo
Date Time Interrogation Session: 20161109151321
Implantable Lead Implant Date: 20090226
Implantable Lead Location: 753860
Lead Channel Pacing Threshold Amplitude: 0.75 V
Lead Channel Pacing Threshold Pulse Width: 0.4 ms
Lead Channel Setting Pacing Pulse Width: 0.4 ms
Lead Channel Setting Sensing Sensitivity: 5.6 mV
MDC IDC LEAD IMPLANT DT: 20090226
MDC IDC LEAD LOCATION: 753859
MDC IDC LEAD MODEL: 4592
MDC IDC MSMT BATTERY IMPEDANCE: 200 Ohm
MDC IDC MSMT BATTERY VOLTAGE: 2.79 V
MDC IDC MSMT LEADCHNL RA IMPEDANCE VALUE: 626 Ohm
MDC IDC MSMT LEADCHNL RA PACING THRESHOLD AMPLITUDE: 0.5 V
MDC IDC MSMT LEADCHNL RA PACING THRESHOLD PULSEWIDTH: 0.4 ms
MDC IDC MSMT LEADCHNL RA SENSING INTR AMPL: 1.4 mV
MDC IDC MSMT LEADCHNL RV IMPEDANCE VALUE: 727 Ohm
MDC IDC SET LEADCHNL RA PACING AMPLITUDE: 1.5 V
MDC IDC SET LEADCHNL RV PACING AMPLITUDE: 2 V
MDC IDC STAT BRADY AP VP PERCENT: 71 %
MDC IDC STAT BRADY AP VS PERCENT: 0 %
MDC IDC STAT BRADY AS VP PERCENT: 29 %
MDC IDC STAT BRADY AS VS PERCENT: 0 %

## 2015-07-09 ENCOUNTER — Encounter: Payer: Self-pay | Admitting: Cardiovascular Disease

## 2015-09-11 ENCOUNTER — Emergency Department (HOSPITAL_COMMUNITY): Payer: Medicare Other

## 2015-09-11 ENCOUNTER — Emergency Department (HOSPITAL_COMMUNITY)
Admission: EM | Admit: 2015-09-11 | Discharge: 2015-09-11 | Disposition: A | Discharge status: Home / Self Care | Payer: Medicare Other | Attending: Emergency Medicine | Admitting: Emergency Medicine

## 2015-09-11 ENCOUNTER — Telehealth: Payer: Self-pay | Admitting: Cardiology

## 2015-09-11 ENCOUNTER — Encounter: Payer: Medicare Other | Admitting: *Deleted

## 2015-09-11 DIAGNOSIS — F0390 Unspecified dementia without behavioral disturbance: Secondary | ICD-10-CM | POA: Insufficient documentation

## 2015-09-11 DIAGNOSIS — S0012XA Contusion of left eyelid and periocular area, initial encounter: Secondary | ICD-10-CM | POA: Diagnosis not present

## 2015-09-11 DIAGNOSIS — Z79899 Other long term (current) drug therapy: Secondary | ICD-10-CM | POA: Diagnosis not present

## 2015-09-11 DIAGNOSIS — E785 Hyperlipidemia, unspecified: Secondary | ICD-10-CM | POA: Diagnosis not present

## 2015-09-11 DIAGNOSIS — Y999 Unspecified external cause status: Secondary | ICD-10-CM | POA: Diagnosis not present

## 2015-09-11 DIAGNOSIS — E079 Disorder of thyroid, unspecified: Secondary | ICD-10-CM | POA: Insufficient documentation

## 2015-09-11 DIAGNOSIS — W19XXXA Unspecified fall, initial encounter: Secondary | ICD-10-CM | POA: Diagnosis not present

## 2015-09-11 DIAGNOSIS — Z8673 Personal history of transient ischemic attack (TIA), and cerebral infarction without residual deficits: Secondary | ICD-10-CM | POA: Insufficient documentation

## 2015-09-11 DIAGNOSIS — H548 Legal blindness, as defined in USA: Secondary | ICD-10-CM | POA: Diagnosis not present

## 2015-09-11 DIAGNOSIS — Y9389 Activity, other specified: Secondary | ICD-10-CM | POA: Insufficient documentation

## 2015-09-11 DIAGNOSIS — Y92129 Unspecified place in nursing home as the place of occurrence of the external cause: Secondary | ICD-10-CM | POA: Insufficient documentation

## 2015-09-11 DIAGNOSIS — Z950 Presence of cardiac pacemaker: Secondary | ICD-10-CM | POA: Diagnosis not present

## 2015-09-11 DIAGNOSIS — M25531 Pain in right wrist: Secondary | ICD-10-CM

## 2015-09-11 DIAGNOSIS — I10 Essential (primary) hypertension: Secondary | ICD-10-CM | POA: Diagnosis not present

## 2015-09-11 DIAGNOSIS — Z7951 Long term (current) use of inhaled steroids: Secondary | ICD-10-CM | POA: Insufficient documentation

## 2015-09-11 DIAGNOSIS — Z7982 Long term (current) use of aspirin: Secondary | ICD-10-CM | POA: Diagnosis not present

## 2015-09-11 DIAGNOSIS — S0083XA Contusion of other part of head, initial encounter: Secondary | ICD-10-CM

## 2015-09-11 DIAGNOSIS — S6991XA Unspecified injury of right wrist, hand and finger(s), initial encounter: Secondary | ICD-10-CM | POA: Diagnosis not present

## 2015-09-11 NOTE — Discharge Instructions (Signed)
Her CT scan of her face and head are without acute injury other than the bruising. She was also complaining of right wrist pain and there is some old bruising, but the xray does not show a fracture. Ice packs if she will tolerate it on her bruised areas. She should be rechecked for any problems on the head injury sheet.  Cryotherapy Cryotherapy is when you put ice on your injury. Ice helps lessen pain and puffiness (swelling) after an injury. Ice works the best when you start using it in the first 24 to 48 hours after an injury. HOME CARE  Put a dry or damp towel between the ice pack and your skin.  You may press gently on the ice pack.  Leave the ice on for no more than 10 to 20 minutes at a time.  Check your skin after 5 minutes to make sure your skin is okay.  Rest at least 20 minutes between ice pack uses.  Stop using ice when your skin loses feeling (numbness).  Do not use ice on someone who cannot tell you when it hurts. This includes small children and people with memory problems (dementia). GET HELP RIGHT AWAY IF:  You have white spots on your skin.  Your skin turns blue or pale.  Your skin feels waxy or hard.  Your puffiness gets worse. MAKE SURE YOU:   Understand these instructions.  Will watch your condition.  Will get help right away if you are not doing well or get worse.   This information is not intended to replace advice given to you by your health care provider. Make sure you discuss any questions you have with your health care provider.   Document Released: 01/06/2008 Document Revised: 10/12/2011 Document Reviewed: 03/12/2011 Elsevier Interactive Patient Education 2016 Elsevier Inc.  Facial or Scalp Contusion  A facial or scalp contusion is a deep bruise on the face or head. Contusions happen when an injury causes bleeding under the skin. Signs of bruising include pain, puffiness (swelling), and discolored skin. The contusion may turn blue, purple, or  yellow. HOME CARE  Only take medicines as told by your doctor.  Put ice on the injured area.  Put ice in a plastic bag.  Place a towel between your skin and the bag.  Leave the ice on for 20 minutes, 2-3 times a day. GET HELP IF:  You have bite problems.  You have pain when chewing.  You are worried about your face not healing normally. GET HELP RIGHT AWAY IF:   You have severe pain or a headache and medicine does not help.  You are very tired or confused, or your personality changes.  You throw up (vomit).  You have a nosebleed that will not stop.  You see two of everything (double vision) or have blurry vision.  You have fluid coming from your nose or ear.  You have problems walking or using your arms or legs. MAKE SURE YOU:   Understand these instructions.  Will watch your condition.  Will get help right away if you are not doing well or get worse.   This information is not intended to replace advice given to you by your health care provider. Make sure you discuss any questions you have with your health care provider.   Document Released: 07/09/2011 Document Revised: 08/10/2014 Document Reviewed: 03/02/2013 Elsevier Interactive Patient Education 2016 Elsevier Inc.  Head Injury, Adult You have a head injury. Headaches and throwing up (vomiting) are common after  a head injury. It should be easy to wake up from sleeping. Sometimes you must stay in the hospital. Most problems happen within the first 24 hours. Side effects may occur up to 7-10 days after the injury.  WHAT ARE THE TYPES OF HEAD INJURIES? Head injuries can be as minor as a bump. Some head injuries can be more severe. More severe head injuries include:  A jarring injury to the brain (concussion).  A bruise of the brain (contusion). This mean there is bleeding in the brain that can cause swelling.  A cracked skull (skull fracture).  Bleeding in the brain that collects, clots, and forms a bump  (hematoma). WHEN SHOULD I GET HELP RIGHT AWAY?   You are confused or sleepy.  You cannot be woken up.  You feel sick to your stomach (nauseous) or keep throwing up (vomiting).  Your dizziness or unsteadiness is getting worse.  You have very bad, lasting headaches that are not helped by medicine. Take medicines only as told by your doctor.  You cannot use your arms or legs like normal.  You cannot walk.  You notice changes in the black spots in the center of the colored part of your eye (pupil).  You have clear or bloody fluid coming from your nose or ears.  You have trouble seeing. During the next 24 hours after the injury, you must stay with someone who can watch you. This person should get help right away (call 911 in the [place] if you start to shake and are not able to control it (have seizures), you pass out, or you are unable to wake up. HOW CAN I PREVENT A HEAD INJURY IN THE FUTURE?  Wear seat belts.  Wear a helmet while bike riding and playing sports like football.  Stay away from dangerous activities around the house. WHEN CAN I RETURN TO NORMAL ACTIVITIES AND ATHLETICS? See your doctor before doing these activities. You should not do normal activities or play contact sports until 1 week after the following symptoms have stopped:  Headache that does not go away.  Dizziness.  Poor attention.  Confusion.  Memory problems.  Sickness to your stomach or throwing up.  Tiredness.  Fussiness.  Bothered by bright lights or loud noises.  Anxiousness or depression.  Restless sleep. MAKE SURE YOU:   Understand these instructions.  Will watch your condition.  Will get help right away if you are not doing well or get worse.   This information is not intended to replace advice given to you by your health care provider. Make sure you discuss any questions you have with your health care provider.   Document Released: 07/02/2008 Document Revised: 08/10/2014  Document Reviewed: 03/27/2013 Elsevier Interactive Patient Education [employer]

## 2015-09-11 NOTE — ED Notes (Signed)
PTAR CALLED @ 0500.

## 2015-09-11 NOTE — ED Provider Notes (Signed)
CSN: [id number]     Arrival date & time 09/11/15  0019 History  By signing my name below, I, Soijett Blue, attest that this documentation has been prepared under the direction and in the presence of Devoria Albe, MD at 0121. Electronically Signed: Soijett Blue, ED Scribe. 09/11/2015. 2:18 AM.   Chief Complaint  Patient presents with  . Fall   LEVEL 5 CAVEAT: DEMENTIA   The history is provided by the patient. No language interpreter was used.    Grace Silva is a 82 y.o. female with a medical hx of TIA, HTN, legally blind, pacemaker, who presents to the Emergency Department via EMS complaining of a fall occurring tonight PTA. Staff at pt nursing home notes that she fell while at her nursing home. Patient states "I just fell". Pt denies pain anywhere at this time. She states "just give it some time I'm okay". Denies any other symptoms.  PCP Dr Donette Larry  Past Medical History  Diagnosis Date  . Thyroid disease   . Legally blind   . Hypertension   . Pacemaker 09/29/07/04/10/13    medtronic/medtronic adapta  . Transient ischemic attack (TIA)   . Hyperlipemia   . Dementia   . SSS (sick sinus syndrome) (HCC) 2009    rec'd pacer (bradycardia/syncope)  . H/O cardiovascular stress test 07/12/2008    Lexiscan myoview EF 68%, no ischemia  . H/O echocardiogram 02/20/2013    EF 55-60%; aortic scerosis   Past Surgical History  Procedure Laterality Date  . Pacemaker placement  09/29/2007    medtronic enrhythm  . Permanent pacemaker generator change  04/10/2013    medtronic adapta- due to ERI  . Pacemaker generator change N/A 04/10/2013    Procedure: PACEMAKER GENERATOR CHANGE;  Surgeon: Thurmon Fair, MD;  Location: [facility];  Service: Cardiovascular;  Laterality: N/A;   Family History  Problem Relation Age of Onset  . Cancer Mother   . Pneumonia Brother    Social History  Substance Use Topics  . Smoking status: Never Smoker   . Smokeless tobacco: Never Used  . Alcohol Use: No  lives in  [place]  OB History    No data available     Review of Systems  Unable to perform ROS: Dementia      Allergies  Review of patient's allergies indicates no known allergies.  Home Medications   Prior to Admission medications   Medication Sig Start Date End Date Taking? Authorizing Provider  acetaminophen (TYLENOL) 325 MG tablet Take 650 mg by mouth every 6 (six) hours as needed for mild pain.   Yes Historical Provider, MD  acetaminophen (TYLENOL) 500 MG tablet Take 500 mg by mouth 2 (two) times daily.    Yes Historical Provider, MD  aspirin 81 MG tablet Take 81 mg by mouth daily.   Yes Historical Provider, MD  captopril (CAPOTEN) 50 MG tablet Take 1 tablet by mouth every 8 (eight) hours.  06/07/15  Yes Historical Provider, MD  cholecalciferol (VITAMIN D) 1000 units tablet Take 1,000 Units by mouth daily.   Yes Historical Provider, MD  fluticasone (FLONASE) 50 MCG/ACT nasal spray Place 1 spray into both nostrils daily.   Yes Historical Provider, MD  gabapentin (NEURONTIN) 100 MG capsule Take 100-200 mg by mouth 3 (three) times daily. Take  every morning and  in the afternoon and at bedtime.   Yes Historical Provider, MD  levothyroxine (SYNTHROID, LEVOTHROID) 75 MCG tablet Take 1 tablet (75 mcg total) by mouth daily before breakfast.  03/09/13  Yes Susa Griffins, MD  loratadine (CLARITIN) 10 MG tablet Take 10 mg by mouth daily.   Yes Historical Provider, MD  Melatonin 1 MG TABS Take 2 tablets by mouth at bedtime.   Yes Historical Provider, MD  metoprolol succinate (TOPROL-XL) 50 MG 24 hr tablet Take 50 mg by mouth daily. Take with or immediately following a meal.   Yes Historical Provider, MD  Multiple Vitamins-Minerals (PRESERVISION AREDS 2 PO) Take 1 capsule by mouth 2 (two) times daily.   Yes Historical Provider, MD  Polyethyl Glycol-Propyl Glycol (SYSTANE OP) Place 1 drop into both eyes 3 (three) times daily.   Yes Historical Provider, MD  polyvinyl alcohol (ARTIFICIAL TEARS) 1.4  % ophthalmic solution Place 1 drop into both eyes 3 (three) times daily.   Yes Historical Provider, MD  rosuvastatin (CRESTOR) 5 MG tablet Take 5 mg by mouth daily.   Yes Historical Provider, MD   BP 187/81 mmHg  Pulse 59  Temp(Src) 98 F (36.7 C) (Oral)  Resp 17  SpO2 99%  Vital signs normal   Physical Exam  Constitutional: She is oriented to person, place, and time. She appears well-developed and well-nourished.  Non-toxic appearance. She does not appear ill. No distress.  Pt has trouble following commands mainly because she is talking stating "I'm okay".   HENT:  Head: Normocephalic.  Right Ear: External ear normal.  Left Ear: External ear normal.  Nose: Nose normal. No mucosal edema or rhinorrhea.  Mouth/Throat: Oropharynx is clear and moist and mucous membranes are normal. No dental abscesses or uvula swelling.  Edentulous, patient's noted to have bruising and swelling around her left upper eyelids and the left cheek area.  Eyes: Conjunctivae and EOM are normal. Pupils are equal, round, and reactive to light.  Neck: Normal range of motion and full passive range of motion without pain. Neck supple.  Cardiovascular: Normal rate, regular rhythm and normal heart sounds.  Exam reveals no gallop and no friction rub.   No murmur heard. Pulmonary/Chest: Effort normal and breath sounds normal. No respiratory distress. She has no wheezes. She has no rhonchi. She has no rales. She exhibits no tenderness and no crepitus.  Abdominal: Soft. Normal appearance and bowel sounds are normal. She exhibits no distension. There is no tenderness. There is no rebound and no guarding.  Musculoskeletal: Normal range of motion. She exhibits no edema or tenderness.  Moves all extremities well without pain except for right wrist which is painful, however she has full range of motion of the wrist.. No obvious deformity. Old bruising noted. Skin tear on right forearm that has a dressing on it.   Neurological:  She is alert and oriented to person, place, and time. She has normal strength. No cranial nerve deficit.  Skin: Skin is warm, dry and intact. No rash noted. No erythema. No pallor.  Psychiatric: She has a normal mood and affect. Her speech is normal and behavior is normal. Her mood appears not anxious.  Nursing note and vitals reviewed.      ED Course  Procedures (including critical care time) DIAGNOSTIC STUDIES: Oxygen Saturation is 100% on RA, nl by my interpretation.    COORDINATION OF CARE: 1:30 AM Discussed treatment plan with pt at bedside and pt agreed to plan. CT scans of the head in the face were ordered to rule out fracture. I suspect her wrist pain is probably from an older injury however although she does not volunteer it hurts it does seem to be  a little painful on exam. X-ray of her wrist was also ordered.  Patient scans and x-rays do not show any acute injury except for the bruising that is apparent on gross examination.   Dg Wrist Complete Right  09/11/2015  CLINICAL DATA:  82 year old female with fall and right wrist pain. EXAM: RIGHT WRIST - COMPLETE 3+ VIEW COMPARISON:  None. FINDINGS: There is no acute fracture or dislocation. There is advanced osteopenia which limits evaluation for fracture. CT or MRI may provide better evaluation if there is high clinical concern for fracture. The soft tissues appear unremarkable. Vascular calcifications noted. IMPRESSION: No definite acute fracture or dislocation.  Advanced osteopenia. Electronically Signed   By: Elgie Collard M.D.   On: 09/11/2015 03:19   Ct Head Wo Contrast  Ct Maxillofacial Wo Cm  09/11/2015  CLINICAL DATA:  82 year old female with fall EXAM: CT HEAD WITHOUT CONTRAST CT MAXILLOFACIAL WITHOUT CONTRAST TECHNIQUE: Multidetector CT imaging of the head and maxillofacial structures were performed using the standard protocol without intravenous contrast. Multiplanar CT image reconstructions of the maxillofacial  structures were also generated. COMPARISON:  CT dated 02/19/2013 FINDINGS: CT HEAD FINDINGS There is age-related atrophy and chronic microvascular ischemic disease. Large area of old infarct and encephalomalacia noted in the right temporal and occipital lobes. There has been interval evolution of the right basal ganglia infarct with encephalomalacia and ex vacuo dilatation of the right lateral ventricle. There is no acute intracranial hemorrhage. No mass effect or midline shift identified. The visualized paranasal sinuses and mastoid air cells are clear. The calvarium is intact. CT MAXILLOFACIAL FINDINGS There is old fracture of the left mandibular condyle. No acute fracture identified. There is partial opacification of multiple ethmoid air cells. The remainder of paranasal sinuses and mastoid air cells are well aerated. Postsurgical changes of cataract surgery. The soft tissues appear unremarkable. IMPRESSION: No acute intracranial hemorrhage. Age-related atrophy and chronic microvascular ischemic disease. 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 No acute facial fractures. Electronically Signed   By: Elgie Collard M.D.   On: 09/11/2015 03:35      I have personally reviewed and evaluated these images and lab results as part of my medical decision-making.    MDM   Final diagnoses:  Contusion of face, initial encounter  Fall at nursing home, initial encounter  Wrist pain, right    Plan discharge  Devoria Albe, MD, FACEP   I personally performed the services described in this documentation, which was scribed in my presence. The recorded information has been reviewed and considered.  Devoria Albe, MD, Concha Pyo, MD 09/11/15 [phone]

## 2015-09-11 NOTE — Telephone Encounter (Signed)
Confirmed remote transmission w/ pt niece.   

## 2015-09-13 ENCOUNTER — Encounter: Payer: Self-pay | Admitting: Cardiology

## 2015-10-06 ENCOUNTER — Encounter (HOSPITAL_COMMUNITY): Payer: Self-pay | Admitting: *Deleted

## 2015-10-06 ENCOUNTER — Encounter (HOSPITAL_COMMUNITY)
Admission: EM | Disposition: A | Discharge status: Skilled Nursing Facility | Payer: Self-pay | Source: Home / Self Care | Attending: Emergency Medicine

## 2015-10-06 ENCOUNTER — Emergency Department (HOSPITAL_COMMUNITY): Payer: Medicare Other | Admitting: Anesthesiology

## 2015-10-06 ENCOUNTER — Emergency Department (HOSPITAL_COMMUNITY): Payer: Medicare Other

## 2015-10-06 ENCOUNTER — Ambulatory Visit (HOSPITAL_COMMUNITY)
Admission: EM | Admit: 2015-10-06 | Discharge: 2015-10-07 | Disposition: A | Discharge status: Skilled Nursing Facility | Payer: Medicare Other | Attending: Internal Medicine | Admitting: Internal Medicine

## 2015-10-06 DIAGNOSIS — S67192A Crushing injury of right middle finger, initial encounter: Secondary | ICD-10-CM | POA: Insufficient documentation

## 2015-10-06 DIAGNOSIS — I10 Essential (primary) hypertension: Secondary | ICD-10-CM | POA: Diagnosis not present

## 2015-10-06 DIAGNOSIS — E039 Hypothyroidism, unspecified: Secondary | ICD-10-CM | POA: Insufficient documentation

## 2015-10-06 DIAGNOSIS — Z7951 Long term (current) use of inhaled steroids: Secondary | ICD-10-CM | POA: Diagnosis not present

## 2015-10-06 DIAGNOSIS — F03918 Unspecified dementia, unspecified severity, with other behavioral disturbance: Secondary | ICD-10-CM | POA: Diagnosis present

## 2015-10-06 DIAGNOSIS — Z8673 Personal history of transient ischemic attack (TIA), and cerebral infarction without residual deficits: Secondary | ICD-10-CM | POA: Insufficient documentation

## 2015-10-06 DIAGNOSIS — Z22322 Carrier or suspected carrier of Methicillin resistant Staphylococcus aureus: Secondary | ICD-10-CM | POA: Insufficient documentation

## 2015-10-06 DIAGNOSIS — S6710XA Crushing injury of unspecified finger(s), initial encounter: Secondary | ICD-10-CM | POA: Diagnosis not present

## 2015-10-06 DIAGNOSIS — I442 Atrioventricular block, complete: Secondary | ICD-10-CM | POA: Diagnosis not present

## 2015-10-06 DIAGNOSIS — H548 Legal blindness, as defined in USA: Secondary | ICD-10-CM | POA: Diagnosis not present

## 2015-10-06 DIAGNOSIS — W230XXA Caught, crushed, jammed, or pinched between moving objects, initial encounter: Secondary | ICD-10-CM | POA: Insufficient documentation

## 2015-10-06 DIAGNOSIS — R05 Cough: Secondary | ICD-10-CM

## 2015-10-06 DIAGNOSIS — F0391 Unspecified dementia with behavioral disturbance: Secondary | ICD-10-CM | POA: Insufficient documentation

## 2015-10-06 DIAGNOSIS — Z79899 Other long term (current) drug therapy: Secondary | ICD-10-CM | POA: Insufficient documentation

## 2015-10-06 DIAGNOSIS — Z7982 Long term (current) use of aspirin: Secondary | ICD-10-CM | POA: Diagnosis not present

## 2015-10-06 DIAGNOSIS — Z66 Do not resuscitate: Secondary | ICD-10-CM | POA: Diagnosis not present

## 2015-10-06 DIAGNOSIS — S62635B Displaced fracture of distal phalanx of left ring finger, initial encounter for open fracture: Secondary | ICD-10-CM

## 2015-10-06 DIAGNOSIS — Z950 Presence of cardiac pacemaker: Secondary | ICD-10-CM | POA: Diagnosis not present

## 2015-10-06 DIAGNOSIS — Y92129 Unspecified place in nursing home as the place of occurrence of the external cause: Secondary | ICD-10-CM | POA: Insufficient documentation

## 2015-10-06 DIAGNOSIS — R059 Cough, unspecified: Secondary | ICD-10-CM

## 2015-10-06 DIAGNOSIS — S62634B Displaced fracture of distal phalanx of right ring finger, initial encounter for open fracture: Secondary | ICD-10-CM | POA: Diagnosis not present

## 2015-10-06 DIAGNOSIS — I495 Sick sinus syndrome: Secondary | ICD-10-CM | POA: Diagnosis not present

## 2015-10-06 DIAGNOSIS — S67194A Crushing injury of right ring finger, initial encounter: Secondary | ICD-10-CM | POA: Insufficient documentation

## 2015-10-06 DIAGNOSIS — E785 Hyperlipidemia, unspecified: Secondary | ICD-10-CM | POA: Diagnosis present

## 2015-10-06 DIAGNOSIS — S62633B Displaced fracture of distal phalanx of left middle finger, initial encounter for open fracture: Secondary | ICD-10-CM

## 2015-10-06 DIAGNOSIS — S62632B Displaced fracture of distal phalanx of right middle finger, initial encounter for open fracture: Secondary | ICD-10-CM | POA: Insufficient documentation

## 2015-10-06 DIAGNOSIS — S6291XA Unspecified fracture of right wrist and hand, initial encounter for closed fracture: Secondary | ICD-10-CM | POA: Insufficient documentation

## 2015-10-06 DIAGNOSIS — S6990XA Unspecified injury of unspecified wrist, hand and finger(s), initial encounter: Secondary | ICD-10-CM | POA: Diagnosis present

## 2015-10-06 HISTORY — PX: I & D EXTREMITY: SHX5045

## 2015-10-06 HISTORY — DX: Carrier or suspected carrier of methicillin resistant Staphylococcus aureus: Z22.322

## 2015-10-06 LAB — BASIC METABOLIC PANEL
ANION GAP: 11 (ref 5–15)
BUN: 22 mg/dL — ABNORMAL HIGH (ref 6–20)
CALCIUM: 9.1 mg/dL (ref 8.9–10.3)
CO2: 27 mmol/L (ref 22–32)
Chloride: 108 mmol/L (ref 101–111)
Creatinine, Ser: 0.9 mg/dL (ref 0.44–1.00)
GFR, EST NON AFRICAN AMERICAN: 53 mL/min — AB (ref >60)
GLUCOSE: 75 mg/dL (ref 65–99)
Potassium: 4.1 mmol/L (ref 3.5–5.1)
Sodium: 146 mmol/L — ABNORMAL HIGH (ref 135–145)

## 2015-10-06 LAB — CBC
HCT: 42.9 % (ref 36.0–46.0)
Hemoglobin: 13.3 g/dL (ref 12.0–15.0)
MCH: 29.4 pg (ref 26.0–34.0)
MCHC: 31 g/dL (ref 30.0–36.0)
MCV: 94.7 fL (ref 78.0–100.0)
PLATELETS: 148 10*3/uL — AB (ref 150–400)
RBC: 4.53 MIL/uL (ref 3.87–5.11)
RDW: 15.5 % (ref 11.5–15.5)
WBC: 4.5 10*3/uL (ref 4.0–10.5)

## 2015-10-06 SURGERY — IRRIGATION AND DEBRIDEMENT EXTREMITY
Anesthesia: General | Site: Hand | Laterality: Right

## 2015-10-06 MED ORDER — ONDANSETRON HCL 4 MG PO TABS: 4.0000 mg | ORAL_TABLET | Freq: Four times a day (QID) | ORAL | Status: DC | PRN

## 2015-10-06 MED ORDER — PHENYLEPHRINE 40 MCG/ML (10ML) SYRINGE FOR IV PUSH (FOR BLOOD PRESSURE SUPPORT)
PREFILLED_SYRINGE | INTRAVENOUS | Status: AC
  Filled 2015-10-06: qty 10

## 2015-10-06 MED ORDER — CEFAZOLIN SODIUM-DEXTROSE 2-3 GM-% IV SOLR
INTRAVENOUS | Status: DC | PRN
Start: 2015-10-06 — End: 2015-10-06
  Administered 2015-10-06: 1 g via INTRAVENOUS

## 2015-10-06 MED ORDER — PROPOFOL 10 MG/ML IV BOLUS
INTRAVENOUS | Status: DC | PRN
  Administered 2015-10-06: 140 mg via INTRAVENOUS

## 2015-10-06 MED ORDER — FENTANYL CITRATE (PF) 250 MCG/5ML IJ SOLN
INTRAMUSCULAR | Status: DC | PRN
  Administered 2015-10-06: 50 ug via INTRAVENOUS

## 2015-10-06 MED ORDER — ROSUVASTATIN CALCIUM 5 MG PO TABS
5.0000 mg | ORAL_TABLET | Freq: Every day | ORAL | Status: DC
  Administered 2015-10-07: 5 mg via ORAL
  Filled 2015-10-06: qty 1

## 2015-10-06 MED ORDER — BUPIVACAINE HCL (PF) 0.25 % IJ SOLN
INTRAMUSCULAR | Status: AC
  Filled 2015-10-06: qty 30

## 2015-10-06 MED ORDER — LIDOCAINE HCL (CARDIAC) 20 MG/ML IV SOLN
INTRAVENOUS | Status: DC | PRN
  Administered 2015-10-06: 50 mg via INTRATRACHEAL

## 2015-10-06 MED ORDER — ONDANSETRON HCL 4 MG/2ML IJ SOLN: 4.0000 mg | Freq: Four times a day (QID) | INTRAMUSCULAR | Status: DC | PRN

## 2015-10-06 MED ORDER — FLUTICASONE PROPIONATE 50 MCG/ACT NA SUSP
1.0000 | Freq: Every day | NASAL | Status: DC
  Administered 2015-10-07: 1 via NASAL
  Filled 2015-10-06: qty 16

## 2015-10-06 MED ORDER — METOPROLOL SUCCINATE ER 50 MG PO TB24
50.0000 mg | ORAL_TABLET | Freq: Every day | ORAL | Status: DC
  Administered 2015-10-07: 50 mg via ORAL
  Filled 2015-10-06: qty 1

## 2015-10-06 MED ORDER — LORATADINE 10 MG PO TABS
10.0000 mg | ORAL_TABLET | Freq: Every day | ORAL | Status: DC
Start: 2015-10-07 — End: 2015-10-07
  Administered 2015-10-07: 10 mg via ORAL
  Filled 2015-10-06: qty 1

## 2015-10-06 MED ORDER — HYPROMELLOSE (GONIOSCOPIC) 2.5 % OP SOLN
Freq: Three times a day (TID) | OPHTHALMIC | Status: DC
Start: 2015-10-06 — End: 2015-10-07
  Administered 2015-10-07: 1 [drp] via OPHTHALMIC
  Administered 2015-10-07: via OPHTHALMIC
  Filled 2015-10-06: qty 15

## 2015-10-06 MED ORDER — ASPIRIN EC 81 MG PO TBEC
81.0000 mg | DELAYED_RELEASE_TABLET | Freq: Every day | ORAL | Status: DC
  Administered 2015-10-07: 81 mg via ORAL
  Filled 2015-10-06: qty 1

## 2015-10-06 MED ORDER — ACETAMINOPHEN 650 MG RE SUPP: 650.0000 mg | Freq: Four times a day (QID) | RECTAL | Status: DC | PRN

## 2015-10-06 MED ORDER — POLYVINYL ALCOHOL 1.4 % OP SOLN
1.0000 [drp] | Freq: Three times a day (TID) | OPHTHALMIC | Status: DC
Start: 2015-10-06 — End: 2015-10-06

## 2015-10-06 MED ORDER — GABAPENTIN 100 MG PO CAPS
200.0000 mg | ORAL_CAPSULE | Freq: Every day | ORAL | Status: DC
  Administered 2015-10-07: 200 mg via ORAL
  Filled 2015-10-06: qty 2

## 2015-10-06 MED ORDER — MELATONIN 3 MG PO TABS
3.0000 mg | ORAL_TABLET | Freq: Every day | ORAL | Status: DC
  Administered 2015-10-07: 3 mg via ORAL
  Filled 2015-10-06: qty 1

## 2015-10-06 MED ORDER — CAPTOPRIL 50 MG PO TABS
50.0000 mg | ORAL_TABLET | Freq: Three times a day (TID) | ORAL | Status: DC
  Administered 2015-10-07 (×2): 50 mg via ORAL
  Filled 2015-10-06 (×3): qty 1

## 2015-10-06 MED ORDER — 0.9 % SODIUM CHLORIDE (POUR BTL) OPTIME
TOPICAL | Status: DC | PRN
  Administered 2015-10-06: 1000 mL

## 2015-10-06 MED ORDER — LEVALBUTEROL HCL 0.63 MG/3ML IN NEBU: 0.6300 mg | INHALATION_SOLUTION | RESPIRATORY_TRACT | Status: DC | PRN

## 2015-10-06 MED ORDER — BUPIVACAINE HCL (PF) 0.25 % IJ SOLN
INTRAMUSCULAR | Status: DC | PRN
  Administered 2015-10-06: 30 mL

## 2015-10-06 MED ORDER — LIDOCAINE HCL 2 % IJ SOLN
20.0000 mL | Freq: Once | INTRAMUSCULAR | Status: DC
  Filled 2015-10-06: qty 20

## 2015-10-06 MED ORDER — HYDRALAZINE HCL 20 MG/ML IJ SOLN: 2.0000 mg | INTRAMUSCULAR | Status: DC | PRN

## 2015-10-06 MED ORDER — MORPHINE SULFATE (PF) 2 MG/ML IV SOLN: 1.0000 mg | INTRAVENOUS | Status: DC | PRN

## 2015-10-06 MED ORDER — ENOXAPARIN SODIUM 30 MG/0.3ML ~~LOC~~ SOLN
30.0000 mg | SUBCUTANEOUS | Status: DC
  Administered 2015-10-07: 30 mg via SUBCUTANEOUS
  Filled 2015-10-06: qty 0.3

## 2015-10-06 MED ORDER — FENTANYL CITRATE (PF) 100 MCG/2ML IJ SOLN
25.0000 ug | INTRAMUSCULAR | Status: DC | PRN
  Administered 2015-10-06 (×3): 25 ug via INTRAVENOUS

## 2015-10-06 MED ORDER — MORPHINE SULFATE (PF) 2 MG/ML IV SOLN
2.0000 mg | Freq: Once | INTRAVENOUS | Status: AC
Start: 2015-10-06 — End: 2015-10-06
  Administered 2015-10-06: 2 mg via INTRAVENOUS
  Filled 2015-10-06: qty 1

## 2015-10-06 MED ORDER — VITAMIN D3 25 MCG (1000 UNIT) PO TABS
1000.0000 [IU] | ORAL_TABLET | Freq: Every day | ORAL | Status: DC
  Administered 2015-10-07: 1000 [IU] via ORAL
  Filled 2015-10-06 (×2): qty 1

## 2015-10-06 MED ORDER — DIPHENHYDRAMINE HCL 25 MG PO CAPS: 25.0000 mg | ORAL_CAPSULE | Freq: Four times a day (QID) | ORAL | Status: DC | PRN

## 2015-10-06 MED ORDER — LACTATED RINGERS IV SOLN: INTRAVENOUS | Status: DC

## 2015-10-06 MED ORDER — SODIUM CHLORIDE 0.9 % IV SOLN
Freq: Once | INTRAVENOUS | Status: AC
  Administered 2015-10-07: via INTRAVENOUS

## 2015-10-06 MED ORDER — LACTATED RINGERS IV SOLN
INTRAVENOUS | Status: DC | PRN
  Administered 2015-10-06: 21:00:00 via INTRAVENOUS

## 2015-10-06 MED ORDER — FENTANYL CITRATE (PF) 250 MCG/5ML IJ SOLN
INTRAMUSCULAR | Status: AC
  Filled 2015-10-06: qty 5

## 2015-10-06 MED ORDER — ACETAMINOPHEN 325 MG PO TABS: 650.0000 mg | ORAL_TABLET | Freq: Four times a day (QID) | ORAL | Status: DC | PRN

## 2015-10-06 MED ORDER — LEVOTHYROXINE SODIUM 75 MCG PO TABS
75.0000 ug | ORAL_TABLET | Freq: Every day | ORAL | Status: DC
  Administered 2015-10-07: 75 ug via ORAL
  Filled 2015-10-06: qty 1

## 2015-10-06 MED ORDER — HYDROCODONE-ACETAMINOPHEN 5-325 MG PO TABS: 1.0000 | ORAL_TABLET | Freq: Four times a day (QID) | ORAL | Status: DC | PRN

## 2015-10-06 MED ORDER — FENTANYL CITRATE (PF) 100 MCG/2ML IJ SOLN
INTRAMUSCULAR | Status: AC
  Administered 2015-10-06: 25 ug via INTRAVENOUS
  Filled 2015-10-06: qty 2

## 2015-10-06 MED ORDER — SUCCINYLCHOLINE CHLORIDE 20 MG/ML IJ SOLN
INTRAMUSCULAR | Status: AC
  Filled 2015-10-06: qty 2

## 2015-10-06 MED ORDER — TETANUS-DIPHTH-ACELL PERTUSSIS 5-2.5-18.5 LF-MCG/0.5 IM SUSP
0.5000 mL | Freq: Once | INTRAMUSCULAR | Status: AC
  Administered 2015-10-06: 0.5 mL via INTRAMUSCULAR
  Filled 2015-10-06: qty 0.5

## 2015-10-06 MED ORDER — CEFAZOLIN SODIUM 1-5 GM-% IV SOLN
1.0000 g | Freq: Once | INTRAVENOUS | Status: AC
  Administered 2015-10-06: 1 g via INTRAVENOUS
  Filled 2015-10-06: qty 50

## 2015-10-06 MED ORDER — CEFAZOLIN SODIUM 1-5 GM-% IV SOLN
1.0000 g | Freq: Three times a day (TID) | INTRAVENOUS | Status: DC
  Administered 2015-10-07: 1 g via INTRAVENOUS
  Filled 2015-10-06 (×3): qty 50

## 2015-10-06 SURGICAL SUPPLY — 55 items
BANDAGE COBAN STERILE 2 (GAUZE/BANDAGES/DRESSINGS) IMPLANT
BANDAGE ELASTIC 3 VELCRO ST LF (GAUZE/BANDAGES/DRESSINGS) ×3 IMPLANT
BANDAGE ELASTIC 4 VELCRO ST LF (GAUZE/BANDAGES/DRESSINGS) ×6 IMPLANT
BNDG COHESIVE 1X5 TAN STRL LF (GAUZE/BANDAGES/DRESSINGS) IMPLANT
BNDG CONFORM 2 STRL LF (GAUZE/BANDAGES/DRESSINGS) IMPLANT
BNDG ESMARK 4X9 LF (GAUZE/BANDAGES/DRESSINGS) IMPLANT
BNDG GAUZE ELAST 4 BULKY (GAUZE/BANDAGES/DRESSINGS) ×6 IMPLANT
CORDS BIPOLAR (ELECTRODE) ×3 IMPLANT
COVER SURGICAL LIGHT HANDLE (MISCELLANEOUS) ×3 IMPLANT
DRAIN PENROSE 1/4X12 LTX STRL (WOUND CARE) IMPLANT
DRAPE OEC MINIVIEW 54X84 (DRAPES) ×3 IMPLANT
DRSG ADAPTIC 3X8 NADH LF (GAUZE/BANDAGES/DRESSINGS) IMPLANT
DRSG EMULSION OIL 3X3 NADH (GAUZE/BANDAGES/DRESSINGS) ×3 IMPLANT
DRSG PAD ABDOMINAL 8X10 ST (GAUZE/BANDAGES/DRESSINGS) ×6 IMPLANT
GAUZE SPONGE 4X4 12PLY STRL (GAUZE/BANDAGES/DRESSINGS) ×6 IMPLANT
GAUZE XEROFORM 1X8 LF (GAUZE/BANDAGES/DRESSINGS) ×6 IMPLANT
GLOVE BIO SURGEON STRL SZ7.5 (GLOVE) ×3 IMPLANT
GLOVE BIOGEL PI IND STRL 8 (GLOVE) ×1 IMPLANT
GLOVE BIOGEL PI INDICATOR 8 (GLOVE) ×2
GLOVE SURG SS PI 7.0 STRL IVOR (GLOVE) ×9 IMPLANT
GLOVE SURG SS PI 8.0 STRL IVOR (GLOVE) ×6 IMPLANT
GOWN STRL REUS W/ TWL LRG LVL3 (GOWN DISPOSABLE) ×1 IMPLANT
GOWN STRL REUS W/TWL 2XL LVL3 (GOWN DISPOSABLE) ×6 IMPLANT
GOWN STRL REUS W/TWL LRG LVL3 (GOWN DISPOSABLE) ×2
KIT BASIN OR (CUSTOM PROCEDURE TRAY) ×3 IMPLANT
KIT ROOM TURNOVER OR (KITS) ×3 IMPLANT
LOOP VESSEL MAXI BLUE (MISCELLANEOUS) IMPLANT
MANIFOLD NEPTUNE II (INSTRUMENTS) IMPLANT
NEEDLE HYPO 25X1 1.5 SAFETY (NEEDLE) ×3 IMPLANT
NS IRRIG 1000ML POUR BTL (IV SOLUTION) ×3 IMPLANT
PACK ORTHO EXTREMITY (CUSTOM PROCEDURE TRAY) ×3 IMPLANT
PAD ARMBOARD 7.5X6 YLW CONV (MISCELLANEOUS) ×6 IMPLANT
PADDING CAST COTTON 6X4 STRL (CAST SUPPLIES) ×3 IMPLANT
SCRUB BETADINE 4OZ XXX (MISCELLANEOUS) ×3 IMPLANT
SET CYSTO W/LG BORE CLAMP LF (SET/KITS/TRAYS/PACK) IMPLANT
SLING ARM FOAM STRAP MED (SOFTGOODS) ×3 IMPLANT
SOLUTION BETADINE 4OZ (MISCELLANEOUS) ×3 IMPLANT
SPLINT PLASTER CAST XFAST 3X15 (CAST SUPPLIES) ×1 IMPLANT
SPLINT PLASTER XTRA FASTSET 3X (CAST SUPPLIES) ×2
SPONGE LAP 4X18 X RAY DECT (DISPOSABLE) IMPLANT
SUT CHROMIC 6 0 PS 4 (SUTURE) ×3 IMPLANT
SUT ETHILON 4 0 P 3 18 (SUTURE) IMPLANT
SUT ETHILON 4 0 PS 2 18 (SUTURE) IMPLANT
SUT MON AB 5-0 P3 18 (SUTURE) IMPLANT
SUT MON AB 5-0 PS2 18 (SUTURE) ×6 IMPLANT
SYR CONTROL 10ML LL (SYRINGE) IMPLANT
TOWEL OR 17X24 6PK STRL BLUE (TOWEL DISPOSABLE) ×3 IMPLANT
TOWEL OR 17X26 10 PK STRL BLUE (TOWEL DISPOSABLE) ×3 IMPLANT
TUBE ANAEROBIC SPECIMEN COL (MISCELLANEOUS) IMPLANT
TUBE CONNECTING 12'X1/4 (SUCTIONS) ×1
TUBE CONNECTING 12X1/4 (SUCTIONS) ×2 IMPLANT
TUBE FEEDING 5FR 15 INCH (TUBING) IMPLANT
UNDERPAD 30X30 INCONTINENT (UNDERPADS AND DIAPERS) ×3 IMPLANT
WATER STERILE IRR 1000ML POUR (IV SOLUTION) IMPLANT
YANKAUER SUCT BULB TIP NO VENT (SUCTIONS) ×3 IMPLANT

## 2015-10-06 NOTE — ED Notes (Signed)
Pt presents via [facility] from [facility] for [place] finger injury.  Pt got right hand caught in door hinge causing injury to 3rd and 4th digit.  Bleeding controlled, arm splinted d/t pt movement, ice in place.  Pt oriented to person, at baseline per EMS and facility staff.  Hx: dimentia.  BP-130/72 P-60.  Pt denies hand pain.  NAD on arrival.

## 2015-10-06 NOTE — Discharge Instructions (Signed)

## 2015-10-06 NOTE — Anesthesia Preprocedure Evaluation (Addendum)
Anesthesia Evaluation  Patient identified by MRN, date of birth, ID band Patient awake    Reviewed: Allergy & Precautions, NPO status   Airway Mallampati: II  TM Distance: >3 FB Neck ROM: Full    Dental   Pulmonary neg pulmonary ROS,    breath sounds clear to auscultation       Cardiovascular hypertension, + dysrhythmias + pacemaker  Rhythm:Regular Rate:Normal     Neuro/Psych    GI/Hepatic negative GI ROS, Neg liver ROS,   Endo/Other  negative endocrine ROS  Renal/GU negative Renal ROS     Musculoskeletal   Abdominal   Peds  Hematology   Anesthesia Other Findings   Reproductive/Obstetrics                           Anesthesia Physical Anesthesia Plan  ASA: III  Anesthesia Plan: General   Post-op Pain Management:    Induction: Intravenous  Airway Management Planned: LMA  Additional Equipment:   Intra-op Plan:   Post-operative Plan: Possible Post-op intubation/ventilation  Informed Consent:   Dental advisory given  Plan Discussed with: CRNA, Anesthesiologist and Surgeon  Anesthesia Plan Comments:        Anesthesia Quick Evaluation

## 2015-10-06 NOTE — H&P (Addendum)
[facility] History and Physical  Grace Silva ZOX:[id number] DOB: 07-10-1919 DOA: 10/06/2015  Referring physician: Dr. Merlyn Lot PCP: Grace Hoit, MD   Chief Complaint: right hand caught in a door  HPI:  [name] is is a 82 year old female with a past medical history significant for hypothyroidism, legally blind, complete heart block s/p PM; who presented after apparently getting her right hand fingers caught in a door. She currently resides [facility]. She was transferred here and evaluated initially by Dr. Merlyn Lot after x-rays revealed distal phalanx fractures of digits 3 and 4. Dr. Merlyn Lot discussed with the patient's niece (POA) and it was decided to take her for irrigation and treatment of open fractures of the right hand third and fourth digit with repair of the nail bed. It was not recommended for her to have pinning of fractures due to the patient's current status. Following the procedure we were consulted to admit the patient for observation overnight to monitor.     Review of Systems  Unable to perform ROS: dementia      Past Medical History  Diagnosis Date  . Thyroid disease   . Legally blind   . Hypertension   . Pacemaker 09/29/07/04/10/13    medtronic/medtronic adapta  . Transient ischemic attack (TIA)   . Hyperlipemia   . Dementia   . SSS (sick sinus syndrome) (HCC) 2009    rec'd pacer (bradycardia/syncope)  . H/O cardiovascular stress test 07/12/2008    Lexiscan myoview EF 68%, no ischemia  . H/O echocardiogram 02/20/2013    EF 55-60%; aortic scerosis     Past Surgical History  Procedure Laterality Date  . Pacemaker placement  09/29/2007    medtronic enrhythm  . Permanent pacemaker generator change  04/10/2013    medtronic adapta- due to ERI  . Pacemaker generator change N/A 04/10/2013    Procedure: PACEMAKER GENERATOR CHANGE;  Surgeon: Thurmon Fair, MD;  Location: [facility] CATH LAB;  Service: Cardiovascular;  Laterality: N/A;      Social  History:  reports that she has never smoked. She has never used smokeless tobacco. She reports that she does not drink alcohol or use illicit drugs.  Where does patient live--[facility]  nurs[facility] facility  Can patient participate in ADLs? Needs assistance  No Known Allergies  Family History  Problem Relation Age of Onset  . Cancer Mother   . Pneumonia Brother         Prior to Admission medications   Medication Sig Start Date End Date Taking? Authorizing Provider  acetaminophen (TYLENOL) 325 MG tablet Take 650 mg by mouth every 6 (six) hours as needed for mild pain. Reported on 10/06/2015   Yes Historical Provider, MD  acetaminophen (TYLENOL) 500 MG tablet Take 500 mg by mouth 2 (two) times daily.    Yes Historical Provider, MD  aspirin 81 MG tablet Take 81 mg by mouth daily.   Yes Historical Provider, MD  captopril (CAPOTEN) 50 MG tablet Take 1 tablet by mouth every 8 (eight) hours.  06/07/15  Yes Historical Provider, MD  cholecalciferol (VITAMIN D) 1000 units tablet Take 1,000 Units by mouth daily.   Yes Historical Provider, MD  fluticasone (FLONASE) 50 MCG/ACT nasal spray Place 1 spray into both nostrils daily.   Yes Historical Provider, MD  gabapentin (NEURONTIN) 100 MG capsule Take 100-200 mg by mouth 3 (three) times daily. 200 mg every morning, 100 mg in the afternoon, and 100 mg at night.   Yes Historical Provider, MD  levothyroxine (SYNTHROID, LEVOTHROID)  75 MCG tablet Take 1 tablet (75 mcg total) by mouth daily before breakfast. 03/09/13  Yes Susa Griffinsichard Weintraub, MD  loratadine (CLARITIN) 10 MG tablet Take 10 mg by mouth daily.   Yes Historical Provider, MD  Melatonin 1 MG TABS Take 2 tablets by mouth at bedtime.   Yes Historical Provider, MD  metoprolol succinate (TOPROL-XL) 50 MG 24 hr tablet Take 50 mg by mouth daily. Take with or immediately following a meal.   Yes Historical Provider, MD  Multiple Vitamins-Minerals (PRESERVISION AREDS 2 PO) Take 1 capsule by mouth 2 (two) times  daily.   Yes Historical Provider, MD  Polyethyl Glycol-Propyl Glycol (SYSTANE OP) Place 1 drop into both eyes 3 (three) times daily.   Yes Historical Provider, MD  polyvinyl alcohol (ARTIFICIAL TEARS) 1.4 % ophthalmic solution Place 1 drop into both eyes 3 (three) times daily.   Yes Historical Provider, MD  rosuvastatin (CRESTOR) 5 MG tablet Take 5 mg by mouth daily.   Yes Historical Provider, MD     Physical Exam: Filed Vitals:   10/06/15 2245 10/06/15 2250 10/06/15 2300 10/06/15 2310  BP:  191/93 186/74 183/78  Pulse: 61 65 67 66  Temp:  97.3 F (36.3 C)    TempSrc:      Resp: 12 14 12 11   SpO2: 95% 95% 97% 96%     Constitutional: Vital signs reviewed. Patient is elderly female who is confused and able to communicate, but answers yes to most questions. Head: Normocephalic and atraumatic  Ear: TM normal bilaterally  Mouth: no erythema or exudates, MMM  Eyes: PERRL, EOMI, conjunctivae normal, No scleral icterus.  Neck: Supple, Trachea midline normal ROM, No JVD, mass, thyromegaly, or carotid bruit present.  Cardiovascular: RRR, + murmur Pulmonary/Chest: Decreased overall air movement with gurgling with cough Abdominal: Soft. Non-tender, non-distended, bowel sounds are normal, no masses, organomegaly, or guarding present.  GU: no CVA tenderness Musculoskeletal: No joint deformities, erythema, or stiffness, ROM full and no nontender Ext: Right hand is in a splint and wrapped Hematology: no cervical, inginal, or axillary adenopathy.  Neurological: Alert and able to move all extremities  Skin: Warm, dry and intact. No rash, cyanosis, or clubbing.  Psychiatric:  confused   Data Review   Micro Results No results found for this or any previous visit (from the past 240 hour(s)).  Radiology Reports Dg Wrist Complete Right  09/11/2015  CLINICAL DATA:  82 year old female with fall and right wrist pain. EXAM: RIGHT WRIST - COMPLETE 3+ VIEW COMPARISON:  None. FINDINGS: There is no acute  fracture or dislocation. There is advanced osteopenia which limits evaluation for fracture. CT or MRI may provide better evaluation if there is high clinical concern for fracture. The soft tissues appear unremarkable. Vascular calcifications noted. IMPRESSION: No definite acute fracture or dislocation.  Advanced osteopenia. Electronically Signed   By: Elgie CollardArash  Radparvar M.D.   On: 09/11/2015 03:19   Ct Head Wo Contrast  09/11/2015  CLINICAL DATA:  82 year old female with fall EXAM: CT HEAD WITHOUT CONTRAST CT MAXILLOFACIAL WITHOUT CONTRAST TECHNIQUE: Multidetector CT imaging of the head and maxillofacial structures were performed using the standard protocol without intravenous contrast. Multiplanar CT image reconstructions of the maxillofacial structures were also generated. COMPARISON:  CT dated 02/19/2013 FINDINGS: CT HEAD FINDINGS There is age-related atrophy and chronic microvascular ischemic disease. Large area of old infarct and encephalomalacia noted in the right temporal and occipital lobes. There has been interval evolution of the right basal ganglia infarct with encephalomalacia and ex  vacuo dilatation of the right lateral ventricle. There is no acute intracranial hemorrhage. No mass effect or midline shift identified. The visualized paranasal sinuses and mastoid air cells are clear. The calvarium is intact. CT MAXILLOFACIAL FINDINGS There is old fracture of the left mandibular condyle. No acute fracture identified. There is partial opacification of multiple ethmoid air cells. The remainder of paranasal sinuses and mastoid air cells are well aerated. Postsurgical changes of cataract surgery. The soft tissues appear unremarkable. IMPRESSION: No acute intracranial hemorrhage. Age-related atrophy and chronic microvascular ischemic disease. 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 No acute facial fractures. Electronically Signed   By: Elgie Collard M.D.   On: 09/11/2015 03:35   Dg  Hand Complete Right  10/06/2015  CLINICAL DATA:  Right third and fourth fingers caught in wheelchair with laceration to the tips EXAM: RIGHT HAND - COMPLETE 3+ VIEW COMPARISON:  None. FINDINGS: Limited positioning.  Diffuse osteopenia and arthritic change. Soft tissue injury to the tips of the third and fourth fingers. Mildly displaced fractures involve third and fourth distal phalanges, with distal fracture fragment is displaced distally by about 4 mm. IMPRESSION: Fractures of the third and fourth distal phalanges Electronically Signed   By: Esperanza Heir M.D.   On: 10/06/2015 12:34   Ct Maxillofacial Wo Cm  09/11/2015  CLINICAL DATA:  82 year old female with fall EXAM: CT HEAD WITHOUT CONTRAST CT MAXILLOFACIAL WITHOUT CONTRAST TECHNIQUE: Multidetector CT imaging of the head and maxillofacial structures were performed using the standard protocol without intravenous contrast. Multiplanar CT image reconstructions of the maxillofacial structures were also generated. COMPARISON:  CT dated 02/19/2013 FINDINGS: CT HEAD FINDINGS There is age-related atrophy and chronic microvascular ischemic disease. Large area of old infarct and encephalomalacia noted in the right temporal and occipital lobes. There has been interval evolution of the right basal ganglia infarct with encephalomalacia and ex vacuo dilatation of the right lateral ventricle. There is no acute intracranial hemorrhage. No mass effect or midline shift identified. The visualized paranasal sinuses and mastoid air cells are clear. The calvarium is intact. CT MAXILLOFACIAL FINDINGS There is old fracture of the left mandibular condyle. No acute fracture identified. There is partial opacification of multiple ethmoid air cells. The remainder of paranasal sinuses and mastoid air cells are well aerated. Postsurgical changes of cataract surgery. The soft tissues appear unremarkable. IMPRESSION: No acute intracranial hemorrhage. Age-related atrophy and chronic  microvascular ischemic disease. 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 No acute facial fractures. Electronically Signed   By: Elgie Collard M.D.   On: 09/11/2015 03:35     CBC  Recent Labs Lab 10/06/15 1410  WBC 4.5  HGB 13.3  HCT 42.9  PLT 148*  MCV 94.7  MCH 29.4  MCHC 31.0  RDW 15.5    Chemistries   Recent Labs Lab 10/06/15 1410  NA 146*  K 4.1  CL 108  CO2 27  GLUCOSE 75  BUN 22*  CREATININE 0.90  CALCIUM 9.1   ------------------------------------------------------------------------------------------------------------------ CrCl cannot be calculated (Unknown ideal weight.). ------------------------------------------------------------------------------------------------------------------ No results for input(s): HGBA1C in the last 72 hours. ------------------------------------------------------------------------------------------------------------------ No results for input(s): CHOL, HDL, LDLCALC, TRIG, CHOLHDL, LDLDIRECT in the last 72 hours. ------------------------------------------------------------------------------------------------------------------ No results for input(s): TSH, T4TOTAL, T3FREE, THYROIDAB in the last 72 hours.  Invalid input(s): FREET3 ------------------------------------------------------------------------------------------------------------------ No results for input(s): VITAMINB12, FOLATE, FERRITIN, TIBC, IRON, RETICCTPCT in the last 72 hours.  Coagulation profile No results for input(s): INR, PROTIME in the last 168 hours.  No results  for input(s): DDIMER in the last 72 hours.  Cardiac Enzymes No results for input(s): CKMB, TROPONINI, MYOGLOBIN in the last 168 hours.  Invalid input(s): CK ------------------------------------------------------------------------------------------------------------------ Invalid input(s): POCBNP   CBG: No results for input(s): GLUCAP in the last 168  hours.        Assessment/Plan Crushed finger, distal: Acute. Patient apparently slammed right third and fourth distal phalanges in the door at some point today at the [facility] nursing facility. Initial x-rays showed fractures of the third and fourth distal phalanges. Dr. Merlyn Lot initially admitted and took patient surgery, but patient needed to stay overnight for monitoring prior to being transferred back to the worsening facility.  - Admit to MedSurg bed - Incentive spirometry - Continue postop antibiotics per  hand surgery of cefazolin - Check chest x-ray in a.m.  - Social work consult in a.m. - Elevate head of the bed  - Hydrocodone prn pain - Follow-up with hand surgery for any additional recommendations are not seen in brief op note   Advanced dementia - Nursing orders written for assistance with food  - aspiration precautions  Hypertension: Uncontrolled following patient's procedure - Continue metoprolol and captopril - Hydralazine IV prn  Hypothyroidism - Continue Synthroid  Hyperlipidemia - Continue Crestor  Complete heart block s/p pacemaker:stable  Code Status:   DO NOT RESUSCITATE Family Communication: bedside Disposition Plan: admit   Total time spent 55 minutes.Greater than 50% of this time was spent in counseling, explanation of diagnosis, planning of further management, and coordination of care  Clydie Braun [facility] Pager [phone]  If 7PM-7AM, please contact night-coverage [email or web address] Password [email or web address] 10/06/2015, 11:26 PM

## 2015-10-06 NOTE — ED Provider Notes (Signed)
CSN: [id number]     Arrival date & time 10/06/15  1131 History   First MD Initiated Contact with Patient 10/06/15 1150     Chief Complaint  Patient presents with  . Finger Injury     (Consider location/radiation/quality/duration/timing/severity/associated sxs/prior Treatment) HPI   82 year old elderly demented female with history of TIAs, hypertension, legally blind brought here via EMS from nursing facility for hand injury.  Hx obtain through EMS and nursing staff.  Pt's R hand got caught in a door hinge PTA causing injury to the 3rd, 4th digits.  Her R arm was splinted d/t pt movement, and ice was placed.  Her staff, pt is at her baseline.  No complaint of pain.  Level V caveat due to dementia.    Past Medical History  Diagnosis Date  . Thyroid disease   . Legally blind   . Hypertension   . Pacemaker 09/29/07/04/10/13    medtronic/medtronic adapta  . Transient ischemic attack (TIA)   . Hyperlipemia   . Dementia   . SSS (sick sinus syndrome) (HCC) 2009    rec'd pacer (bradycardia/syncope)  . H/O cardiovascular stress test 07/12/2008    Lexiscan myoview EF 68%, no ischemia  . H/O echocardiogram 02/20/2013    EF 55-60%; aortic scerosis   Past Surgical History  Procedure Laterality Date  . Pacemaker placement  09/29/2007    medtronic enrhythm  . Permanent pacemaker generator change  04/10/2013    medtronic adapta- due to ERI  . Pacemaker generator change N/A 04/10/2013    Procedure: PACEMAKER GENERATOR CHANGE;  Surgeon: Thurmon Fair, MD;  Location: [facility] CATH LAB;  Service: Cardiovascular;  Laterality: N/A;   Family History  Problem Relation Age of Onset  . Cancer Mother   . Pneumonia Brother    Social History  Substance Use Topics  . Smoking status: Never Smoker   . Smokeless tobacco: Never Used  . Alcohol Use: No   OB History    No data available     Review of Systems  Unable to perform ROS: Dementia      Allergies  Review of patient's allergies indicates no known  allergies.  Home Medications   Prior to Admission medications   Medication Sig Start Date End Date Taking? Authorizing Provider  acetaminophen (TYLENOL) 325 MG tablet Take 650 mg by mouth every 6 (six) hours as needed for mild pain. Reported on 10/06/2015   Yes Historical Provider, MD  acetaminophen (TYLENOL) 500 MG tablet Take 500 mg by mouth 2 (two) times daily.    Yes Historical Provider, MD  aspirin 81 MG tablet Take 81 mg by mouth daily.   Yes Historical Provider, MD  captopril (CAPOTEN) 50 MG tablet Take 1 tablet by mouth every 8 (eight) hours.  06/07/15  Yes Historical Provider, MD  cholecalciferol (VITAMIN D) 1000 units tablet Take 1,000 Units by mouth daily.   Yes Historical Provider, MD  fluticasone (FLONASE) 50 MCG/ACT nasal spray Place 1 spray into both nostrils daily.   Yes Historical Provider, MD  gabapentin (NEURONTIN) 100 MG capsule Take 100-200 mg by mouth 3 (three) times daily. 200 mg every morning, 100 mg in the afternoon, and 100 mg at night.   Yes Historical Provider, MD  levothyroxine (SYNTHROID, LEVOTHROID) 75 MCG tablet Take 1 tablet (75 mcg total) by mouth daily before breakfast. 03/09/13  Yes Susa Griffins, MD  loratadine (CLARITIN) 10 MG tablet Take 10 mg by mouth daily.   Yes Historical Provider, MD  Melatonin 1 MG TABS  Take 2 tablets by mouth at bedtime.   Yes Historical Provider, MD  metoprolol succinate (TOPROL-XL) 50 MG 24 hr tablet Take 50 mg by mouth daily. Take with or immediately following a meal.   Yes Historical Provider, MD  Multiple Vitamins-Minerals (PRESERVISION AREDS 2 PO) Take 1 capsule by mouth 2 (two) times daily.   Yes Historical Provider, MD  Polyethyl Glycol-Propyl Glycol (SYSTANE OP) Place 1 drop into both eyes 3 (three) times daily.   Yes Historical Provider, MD  polyvinyl alcohol (ARTIFICIAL TEARS) 1.4 % ophthalmic solution Place 1 drop into both eyes 3 (three) times daily.   Yes Historical Provider, MD  rosuvastatin (CRESTOR) 5 MG tablet Take 5  mg by mouth daily.   Yes Historical Provider, MD   BP 145/64 mmHg  Pulse 60  Temp(Src) 97.8 F (36.6 C) (Oral)  Resp 16  SpO2 98% Physical Exam  Constitutional: She appears well-developed and well-nourished. No distress.  HENT:  Head: Atraumatic.  Eyes: Conjunctivae are normal.  Neck: Neck supple.  Musculoskeletal: She exhibits tenderness (R hand:  Complete avulsion skin laceration to dorsum of third and fourth finger with nail involvement and bone exposed. Mildly delay capillary Refill to tip of third and fourth finger but sensation is intact.).  Neurological: She is alert.  Skin: No rash noted.  Psychiatric: She has a normal mood and affect.  Nursing note and vitals reviewed.   ED Course  Procedures (including critical care time) Labs Review Labs Reviewed - No data to display  Imaging Review Dg Hand Complete Right  10/06/2015  CLINICAL DATA:  Right third and fourth fingers caught in wheelchair with laceration to the tips EXAM: RIGHT HAND - COMPLETE 3+ VIEW COMPARISON:  None. FINDINGS: Limited positioning.  Diffuse osteopenia and arthritic change. Soft tissue injury to the tips of the third and fourth fingers. Mildly displaced fractures involve third and fourth distal phalanges, with distal fracture fragment is displaced distally by about 4 mm. IMPRESSION: Fractures of the third and fourth distal phalanges Electronically Signed   By: Esperanza Heir M.D.   On: 10/06/2015 12:34   I have personally reviewed and evaluated these images and lab results as part of my medical decision-making.   EKG Interpretation None      MDM   Final diagnoses:  Open displaced fracture of distal phalanx of left middle finger, initial encounter  Open displaced fracture of distal phalanx of left ring finger, initial encounter    BP 170/74 mmHg  Pulse 60  Temp(Src) 97.8 F (36.6 C) (Oral)  Resp 16  SpO2 98%         1:44 PM Patient suffered near complete avulsion fractures of her  third and fourth finger in her right hand. These are open fractures. She is unable to provide consent due to her history of dementia. I have called family member and spoke with her niece, Roddie Mc, who is her power of attorney. Her niece did agree to provide informed consent for surgical repair of her wound. Appreciate consultation from hand specialist, Dr. Merlyn Lot who will see patient in the ER and will take patient to the OR for washout and surgical repair. IV antibiotic started. Unknown last meal.  2:11 PM We have contacted nursing home facility and they report that patient's last meal was at 9am.    3:26 PM Dr. Merlyn Lot has seen and evaluated pt in the ER.  He will take her to the OR for surgical washout and repair.  Her niece is at  bedside and give consent for procedure.  Fayrene HelperBowie Harshika Mago, PA-C 10/06/15 1527  Tilden FossaElizabeth Rees, MD 10/08/15 1455

## 2015-10-06 NOTE — Brief Op Note (Signed)
10/06/2015  9:51 PM  PATIENT:  Grace Silva  82 y.o. female  PRE-OPERATIVE DIAGNOSIS:  open fracture of right long and ring finger distal phalanges with nail bed injuries  POST-OPERATIVE DIAGNOSIS:  open fracture of right long and ring finger distal phalanges with nail bed injuries  PROCEDURE:  Procedure(s): IRRIGATION AND DEBRIDEMENT OFOPEN FRACTURES RIGHT LONG AND RING FINGER AND REPAIR OF NAIL BED (Right)  SURGEON:  Surgeon(s) and Role:    * Betha LoaKevin Maximilian Tallo, MD - Primary  PHYSICIAN ASSISTANT:   ASSISTANTS: none   ANESTHESIA:   general  EBL:  Total I/O In: 500 [I.V.:500] Out: -   BLOOD ADMINISTERED:none  DRAINS: none   LOCAL MEDICATIONS USED:  MARCAINE     SPECIMEN:  No Specimen  DISPOSITION OF SPECIMEN:  N/A  COUNTS:  YES  TOURNIQUET:   Total Tourniquet Time Documented: Upper Arm (Right) - -1 minutes Total: Upper Arm (Right) - -1 minutes   DICTATION: .Other Dictation: Dictation Number [id number]  PLAN OF CARE: Discharge to home after PACU  PATIENT DISPOSITION:  PACU - hemodynamically stable.   Delay start of Pharmacological VTE agent (>24hrs) due to surgical blood loss or risk of bleeding: no

## 2015-10-06 NOTE — Transfer of Care (Signed)
Immediate Anesthesia Transfer of Care Note  Patient: Grace Silva  Procedure(s) Performed: Procedure(s): IRRIGATION AND DEBRIDEMENT OFOPEN FRACTURES RIGHT LONG AND RING FINGER AND REPAIR OF NAIL BED (Right)  Patient Location: PACU  Anesthesia Type:General  Level of Consciousness: sedated  Airway & Oxygen Therapy: Patient Spontanous Breathing and Patient connected to face mask oxygen  Post-op Assessment: Report given to RN and Post -op Vital signs reviewed and stable  Post vital signs: Reviewed and stable  Last Vitals:  Filed Vitals:   10/06/15 1700 10/06/15 2207  BP: 180/76   Pulse: 59 64  Temp:  36.4 C  Resp: 16 13    Complications: No apparent anesthesia complications

## 2015-10-06 NOTE — Op Note (Signed)
NAMEALEXARAE, OLIVA NO.:  [id number]  MEDICAL RECORD NO.:  [id number]  LOCATION:  [facility]                         FACILITY:  [facility]  PHYSICIAN:  Betha Loa, MD        DATE OF BIRTH:  June 25, 1919  DATE OF PROCEDURE:  10/06/2015 DATE OF DISCHARGE:                              OPERATIVE REPORT   PREOPERATIVE DIAGNOSIS:  Right long and ring fingertip crush injuries with open distal phalanx fracture and nail bed injury.  POSTOPERATIVE DIAGNOSIS:  Right long and ring fingertip crush injuries with open distal phalanx fracture and nail bed injury.  PROCEDURE:   1. Right long finger irrigation and debridement of open fracture 2. Right long finger open treatment of distal phalanx fracture 3. Right long finger repair of skin and nail bed lacerations 4. Right ring finger irrigation and debridement of open distal phalanx fracture 5. Right ring finger open treatment of distal phalanx fracture 6. Right ring finger repair of skin and nail bed lacerations.  SURGEON:  Betha Loa, MD.  ASSISTANT:  None.  ANESTHESIA:  General.  IV FLUIDS:  Per anesthesia flow sheet.  ESTIMATED BLOOD LOSS:  Minimal.  COMPLICATIONS:  None.  SPECIMENS:  None.  TOURNIQUET TIME:  Approximately 35 minutes.  DISPOSITION:  Stable to PACU.  INDICATIONS:  Ms. Kemmer is a 82 year old female who was brought to the emergency department from [facility] earlier this afternoon after having her finger slammed in a door.  She was evaluated by the emergency department staff and found to have open fractures of the distal phalanges.  Radiographs were taken confirming this.  I was consulted for management of injury.  On examination, she was not following commands.  She had brisk capillary refill in the fingertips. There was avulsion of the nail from the nail folds.  Her niece, who is her power-of-attorney, was present.  I recommended irrigation and debridement of the open fractures with  reduction of the fractures and repair of skin and nail bed lacerations in the operating room.  Risks, benefits, and alternatives of surgery were discussed including risk of blood loss; infection; damage to nerves, vessels, tendons, ligaments, bone; failure of surgery; need for additional surgery; complications with wound healing; continued pain; nonunion; malunion and stiffness.  She voiced understanding of these risks and elected to proceed.  OPERATIVE COURSE:  After being identified preoperatively by myself, the patient's power of attorney and I agreed upon procedure and site of procedure.  Surgical site was marked.  The risks, benefits, and alternatives of surgery were reviewed and they wished to proceed. Surgical consent had been signed.  She was transferred to the operating room and placed on the operating room table in a supine position with the right upper extremity on arm board.  She had been given IV Ancef as preoperative antibiotic prophylaxis.  General anesthesia was induced by Anesthesiology.  Right upper extremity was prepped and draped in normal sterile orthopedic fashion.  Surgical pause was performed between surgeons, anesthesia, and operating room staff, and all were in agreement as to the patient, procedure, and site of procedure. Tourniquet at the proximal aspect of the extremity was inflated to 250 mmHg after exsanguination  of the limb with an Esmarch bandage.  The wounds were explored.  There was no gross contamination.  They were cleared of hematoma.  The nails were removed with a Therapist, nutritionalreer elevator.  The skin was debrided sharply with the scissors to remove torn skin.  The subcutaneous tissues were debrided using a Ray-Tec sponge as well as the bone removing the hematoma.  The wounds were both copiously irrigated with 1000 mL of sterile saline by bulb syringe.  The fracture was reduced under direct visualization.  A 5-0 Monocryl suture was used to repair the skin  lacerations on both the radial and ulnar sides of each finger.  The nail bed lacerations were repaired with 6-0 chromic in an interrupted fashion.  This provided good apposition of all soft tissues.  Xeroform was placed in nail fold and the wound was dressed with sterile Xeroform and 4x4s.  Digital blocks were performed with 10 mL of 0.25% plain Marcaine to aid in postoperative analgesia.  The fingers were then dressed with sterile 4x4s and wrapped with a Kerlix bandage.  A volar and dorsal slab splint including the index, long, ring and small fingers was placed with the MPs flexed and the IPs extended.  This was wrapped with Kerlix and Ace bandage.  Tourniquet was inflated at approximately 35 minutes. Fingertips were all pink with brisk capillary refill to the long and ring fingers, took longer to began filling.  There was good bleeding from the nail beds.  The Ace bandage was also wrapped with some tape to try to prevent her from taking it off.  The operative drapes were broken down and the patient was awoken from anesthesia safely.  She was transferred back to the stretcher and taken to PACU in stable condition. Due to the lateness of completion of the procedure, she will be kept overnight for extended recovery and allowed to return to her facility tomorrow.  Hospitalist consult will be requested.  Delay of start of the procedure was due to the OR being busy.     Betha LoaKevin Love Chowning, MD     KK/MEDQ  D:  10/06/2015  T:  10/06/2015  Job:  [id number]

## 2015-10-06 NOTE — Op Note (Signed)
[id number] 

## 2015-10-06 NOTE — Anesthesia Procedure Notes (Signed)
Procedure Name: Intubation Date/Time: 10/06/2015 9:07 PM Performed by: Brien MatesMAHONY, Hurschel Paynter D Pre-anesthesia Checklist: Patient identified, Emergency Drugs available, Suction available, Patient being monitored and Timeout performed Patient Re-evaluated:Patient Re-evaluated prior to inductionOxygen Delivery Method: Circle system utilized Preoxygenation: Pre-oxygenation with 100% oxygen Intubation Type: IV induction Ventilation: Mask ventilation without difficulty Laryngoscope Size: Miller and 2 Grade View: Grade I Tube type: Oral (attemted placement of LMA, but unable to seat properly.) Tube size: 7.5 mm Number of attempts: 1 Airway Equipment and Method: Stylet Placement Confirmation: ETT inserted through vocal cords under direct vision,  positive ETCO2 and breath sounds checked- equal and bilateral Secured at: 20 cm Tube secured with: Tape Dental Injury: Teeth and Oropharynx as per pre-operative assessment

## 2015-10-06 NOTE — H&P (Signed)
Grace Silva is an 82 y.o. female.   Chief Complaint: right long and ring fingertip crush HPI: 82 yo female resident of [facility] reportedly got right long and ring fingertips smashed in door earlier today.  Brought to [facility] where XR revealed distal phalanx fractures.  Nail avulsions noted clinically.  History from patients niece (POA) and note from Fay[facility] Hel[facility]Bowie Tran, [facility] due to patient dementia.  Allergies: No Known Allergies  Past Medical History  Diagnosis Date  . Thyroid disease   . Legally blind   . Hypertension   . Pacemaker 09/29/07/04/10/13    medtronic/medtronic adapta  . Transient ischemic attack (TIA)   . Hyperlipemia   . Dementia   . SSS (sick sinus syndrome) (HCC) 2009    rec'd pacer (bradycardia/syncope)  . H/O cardiovascular stress test 07/12/2008    Lexiscan myoview EF 68%, no ischemia  . H/O echocardiogram 02/20/2013    EF 55-60%; aortic scerosis    Past Surgical History  Procedure Laterality Date  . Pacemaker placement  09/29/2007    medtronic enrhythm  . Permanent pacemaker generator change  04/10/2013    medtronic adapta- due to ERI  . Pacemaker generator change N/A 04/10/2013    Procedure: PACEMAKER GENERATOR CHANGE;  Surgeon: Thurmon FairMihai Croitoru, MD;  Location: [facility]ATH [facility];  Service: Cardiovascular;  Laterality: N/A;    Family History: Family History  Problem Relation Age of Onset  . Cancer Mother   . Pneumonia Brother     Social History:   reports that she has never smoked. She has never used smokeless tobacco. She reports that she does not drink alcohol or use illicit drugs.  Medications:  (Not in a hospital admission)  No results found for this or any previous visit (from the past 48 hour(s)).  Dg Hand Complete Right  10/06/2015  CLINICAL DATA:  Right third and fourth fingers caught in wheelchair with laceration to the tips EXAM: RIGHT HAND - COMPLETE 3+ VIEW COMPARISON:  None. FINDINGS: Limited positioning.  Diffuse osteopenia and  arthritic change. Soft tissue injury to the tips of the third and fourth fingers. Mildly displaced fractures involve third and fourth distal phalanges, with distal fracture fragment is displaced distally by about 4 mm. IMPRESSION: Fractures of the third and fourth distal phalanges Electronically Signed   By: Esperanza Heiraymond  Rubner M.D.   On: 10/06/2015 12:34     Review of systems not obtained due to patient factors. Patient with dementia.  Blood pressure 187/79, pulse 59, temperature 98.6 F (37 C), temperature source Oral, resp. rate 16, SpO2 97 %.  General appearance: cooperative, appears stated age and no distress does not answer questions or follow commands Head: Normocephalic, without obvious abnormality, atraumatic Neck: supple, symmetrical, trachea midline Resp: clear to auscultation bilaterally Cardio: regular rate and rhythm GI: non-tender Extremities: brisk capillary refill.  Does not follow commands to test motor function or sensation.  Left hand without wounds.  Right long and ring with nail avulsions from nail fold.  No other wounds noted. Pulses: 2+ and symmetric Skin: Skin color, texture, turgor normal. No rashes or lesions Neurologic: unable to assess Incision/Wound: As above  Assessment/Plan Right long and ring fingertip crush injuries with open distal phalanx fractures and nail bed injuries.  Discussed nature of injury with niece who is present.  Recommend OR for irrigation and debridement of fractures/wounds and repair of nail bed injuries.  Would not recommend pinning of fractures due to patient status.  Risks, benefits, and alternatives of  surgery were discussed and the patient's niece/POA agrees with the plan of care.   Regina Ganci R 10/06/2015, 5:17 PM

## 2015-10-06 NOTE — Anesthesia Postprocedure Evaluation (Signed)
Anesthesia Post Note  Patient: Grace Silva  Procedure(s) Performed: Procedure(s) (LRB): IRRIGATION AND DEBRIDEMENT OFOPEN FRACTURES RIGHT LONG AND RING FINGER AND REPAIR OF NAIL BED (Right)  Patient location during evaluation: PACU Anesthesia Type: General Level of consciousness: awake Pain management: pain level controlled Vital Signs Assessment: post-procedure vital signs reviewed and stable Respiratory status: spontaneous breathing Cardiovascular status: stable Anesthetic complications: no    Last Vitals:  Filed Vitals:   10/06/15 2300 10/06/15 2310  BP: 186/74 183/78  Pulse: 67 66  Temp:    Resp: 12 11    Last Pain:  Filed Vitals:   10/06/15 2311  PainSc: 1                  EDWARDS,Mickey Esguerra

## 2015-10-07 ENCOUNTER — Ambulatory Visit (HOSPITAL_COMMUNITY): Payer: Medicare Other

## 2015-10-07 ENCOUNTER — Encounter (HOSPITAL_COMMUNITY): Payer: Self-pay | Admitting: Internal Medicine

## 2015-10-07 DIAGNOSIS — R05 Cough: Secondary | ICD-10-CM | POA: Diagnosis present

## 2015-10-07 DIAGNOSIS — R059 Cough, unspecified: Secondary | ICD-10-CM | POA: Diagnosis present

## 2015-10-07 DIAGNOSIS — I442 Atrioventricular block, complete: Secondary | ICD-10-CM | POA: Diagnosis not present

## 2015-10-07 DIAGNOSIS — F0391 Unspecified dementia with behavioral disturbance: Secondary | ICD-10-CM | POA: Diagnosis not present

## 2015-10-07 DIAGNOSIS — F03918 Unspecified dementia, unspecified severity, with other behavioral disturbance: Secondary | ICD-10-CM | POA: Diagnosis present

## 2015-10-07 DIAGNOSIS — S67192A Crushing injury of right middle finger, initial encounter: Secondary | ICD-10-CM | POA: Diagnosis not present

## 2015-10-07 DIAGNOSIS — S6710XD Crushing injury of unspecified finger(s), subsequent encounter: Secondary | ICD-10-CM | POA: Diagnosis not present

## 2015-10-07 DIAGNOSIS — E785 Hyperlipidemia, unspecified: Secondary | ICD-10-CM

## 2015-10-07 DIAGNOSIS — Z22322 Carrier or suspected carrier of Methicillin resistant Staphylococcus aureus: Secondary | ICD-10-CM

## 2015-10-07 DIAGNOSIS — I10 Essential (primary) hypertension: Secondary | ICD-10-CM

## 2015-10-07 DIAGNOSIS — Z950 Presence of cardiac pacemaker: Secondary | ICD-10-CM

## 2015-10-07 HISTORY — DX: Carrier or suspected carrier of methicillin resistant Staphylococcus aureus: Z22.322

## 2015-10-07 LAB — BASIC METABOLIC PANEL
Anion gap: 11 (ref 5–15)
BUN: 17 mg/dL (ref 6–20)
CALCIUM: 8.4 mg/dL — AB (ref 8.9–10.3)
CO2: 24 mmol/L (ref 22–32)
CREATININE: 0.86 mg/dL (ref 0.44–1.00)
Chloride: 108 mmol/L (ref 101–111)
GFR, EST NON AFRICAN AMERICAN: 56 mL/min — AB (ref >60)
GLUCOSE: 130 mg/dL — AB (ref 65–99)
Potassium: 3.8 mmol/L (ref 3.5–5.1)
Sodium: 143 mmol/L (ref 135–145)

## 2015-10-07 LAB — CBC
HEMATOCRIT: 40.3 % (ref 36.0–46.0)
Hemoglobin: 13.2 g/dL (ref 12.0–15.0)
MCH: 30.3 pg (ref 26.0–34.0)
MCHC: 32.8 g/dL (ref 30.0–36.0)
MCV: 92.4 fL (ref 78.0–100.0)
PLATELETS: 128 10*3/uL — AB (ref 150–400)
RBC: 4.36 MIL/uL (ref 3.87–5.11)
RDW: 15.2 % (ref 11.5–15.5)
WBC: 6.5 10*3/uL (ref 4.0–10.5)

## 2015-10-07 LAB — POCT I-STAT 4, (NA,K, GLUC, HGB,HCT)
GLUCOSE: 97 mg/dL (ref 65–99)
HCT: 40 % (ref 36.0–46.0)
Hemoglobin: 13.6 g/dL (ref 12.0–15.0)
Potassium: 4 mmol/L (ref 3.5–5.1)
Sodium: 145 mmol/L (ref 135–145)

## 2015-10-07 LAB — MRSA PCR SCREENING: MRSA by PCR: POSITIVE — AB

## 2015-10-07 MED ORDER — CHLORHEXIDINE GLUCONATE CLOTH 2 % EX PADS
6.0000 | MEDICATED_PAD | Freq: Every day | CUTANEOUS | Status: DC
  Administered 2015-10-07: 6 via TOPICAL

## 2015-10-07 MED ORDER — GABAPENTIN 100 MG PO CAPS
100.0000 mg | ORAL_CAPSULE | ORAL | Status: DC
  Filled 2015-10-07: qty 1

## 2015-10-07 MED ORDER — HYDROCODONE-ACETAMINOPHEN 5-325 MG PO TABS: 1.0000 | ORAL_TABLET | Freq: Four times a day (QID) | ORAL | Status: DC | PRN

## 2015-10-07 MED ORDER — MUPIROCIN 2 % EX OINT: 1.0000 "application " | TOPICAL_OINTMENT | Freq: Two times a day (BID) | CUTANEOUS | Status: AC

## 2015-10-07 MED ORDER — HYDROCODONE-ACETAMINOPHEN 5-325 MG PO TABS: 1.0000 | ORAL_TABLET | Freq: Four times a day (QID) | ORAL | Status: AC | PRN

## 2015-10-07 MED ORDER — MUPIROCIN 2 % EX OINT
1.0000 "application " | TOPICAL_OINTMENT | Freq: Two times a day (BID) | CUTANEOUS | Status: DC
  Administered 2015-10-07 (×2): 1 via NASAL
  Filled 2015-10-07 (×2): qty 22

## 2015-10-07 NOTE — Progress Notes (Signed)
[facility]

## 2015-10-07 NOTE — Discharge Summary (Signed)
Physician Discharge Summary  Grace Silva WUJ:[id number] DOB: Dec 20, 1918 DOA: 10/06/2015  PCP: Pamelia Hoit, MD  Admit date: 10/06/2015 Discharge date: 10-24-15   Recommendations for Outpatient Follow-Up:   1. The patient will F/U with Dr. Merlyn Lot in 1 week.   Discharge Diagnosis:   Principal Problem:    Crushed finger, distal Active Problems:    HTN (hypertension)    Complete heart block (HCC)    Pacemaker    Dementia with behavioral disturbance    Hyperlipidemia    Cough    MRSA carrier   Discharge disposition:  SNF.  Discharge Condition: Improved.  Diet recommendation: Low sodium, heart healthy.   Wound care: Keep wound dressed until she follows up with Dr. Merlyn Lot.   History of Present Illness:   Grace Silva is an 82 y.o. female with a PMH of hypothyroidism, legal blindness, complete heart block status post pacemaker who was admitted 10/06/15 with hand trauma. X-rays showed distal flanks fractures of digits 3 and 4. She was evaluated by a hand surgeon and is status post irrigation and treatment of open fractures of the right hand third and fourth digit with repair of the nail bed. It was not recommended for her to have pinning of fractures due to the patient's current status. Following the procedure, internal medicine was consulted to admit the patient for observation overnight to monitor.  Hospital Course by Problem:   Principal Problem:  Crushed finger, distal - Status post irrigation and debridement of open fractures, repair of skin and nail bed lacerations 10/06/15. - Continue cefazolin until D/C. - PT/OT consultations performed, D/C back to SNF.  Active Problems:  MRSA carrier - Contact precautions at decontamination therapy ordered.   HTN (hypertension) - Continue captopril and metoprolol.   Complete heart block (HCC)/Pacemaker - Stable.   Dementia with behavioral disturbance - Stable.   Hyperlipidemia - Continue  Crestor.   Hypothyroidism - Continue Synthroid.    Medical Consultants:    Dr. Merlyn Lot, Orthopedic Surgery   Discharge Exam:   Filed Vitals:   10/06/15 2345 10/24/2015 0632  BP: 196/60 168/65  Pulse: 65 67  Temp: 98.2 F (36.8 C) 98.3 F (36.8 C)  Resp: 15 16   Filed Vitals:   10/06/15 2300 10/06/15 2310 10/06/15 2345 2015-10-24 0632  BP: 186/74 183/78 196/60 168/65  Pulse: 67 66 65 67  Temp:   98.2 F (36.8 C) 98.3 F (36.8 C)  TempSrc:   Oral Oral  Resp: SpO2: 97% 96% 96% 100%    Gen:  NAD, frail appearing, sitting up in chair. Cardiovascular:  RRR, No M/R/G Respiratory: Lungs CTAB Gastrointestinal: Abdomen soft, NT/ND with normal active bowel sounds. Extremities: Left hand and forearm in ACE wrap, no edema BLE   The results of significant diagnostics from this hospitalization (including imaging, microbiology, ancillary and laboratory) are listed below for reference.     Procedures and Diagnostic Studies:   Dg Chest Port 1 View  10/24/2015  CLINICAL DATA:  Cough, hypertension EXAM: PORTABLE CHEST 1 VIEW COMPARISON:  None. FINDINGS: LEFT-sided pacemaker overlies normal cardiac silhouette. LEFT basilar atelectasis present. No pneumothorax. No pulmonary edema. IMPRESSION: LEFT basilar atelectasis.  No edema or infiltrate evident.  Number Electronically Signed   By: Genevive Bi M.D.   On: October 24, 2015 07:37   Dg Hand Complete Right  10/06/2015  CLINICAL DATA:  Right third and fourth fingers caught in wheelchair with laceration to the tips EXAM: RIGHT HAND - COMPLETE 3+  VIEW COMPARISON:  None. FINDINGS: Limited positioning.  Diffuse osteopenia and arthritic change. Soft tissue injury to the tips of the third and fourth fingers. Mildly displaced fractures involve third and fourth distal phalanges, with distal fracture fragment is displaced distally by about 4 mm. IMPRESSION: Fractures of the third and fourth distal phalanges Electronically Signed   By: Esperanza Heir M.D.   On: 10/06/2015 12:34     Labs:   Basic Metabolic Panel:  Recent Labs Lab 10/06/15 1410 10/07/15 0539  NA 146* 143  K 4.1 3.8  CL 108 108  CO2 27 24  GLUCOSE 75 130*  BUN 22* 17  CREATININE 0.90 0.86  CALCIUM 9.1 8.4*   GFR CrCl cannot be calculated (Unknown ideal weight.). Liver Function Tests: No results for input(s): AST, ALT, ALKPHOS, BILITOT, PROT, ALBUMIN in the last 168 hours. No results for input(s): LIPASE, AMYLASE in the last 168 hours. No results for input(s): AMMONIA in the last 168 hours. Coagulation profile No results for input(s): INR, PROTIME in the last 168 hours.  CBC:  Recent Labs Lab 10/06/15 1410 10/07/15 0539  WBC 4.5 6.5  HGB 13.3 13.2  HCT 42.9 40.3  MCV 94.7 92.4  PLT 148* 128*   Microbiology Recent Results (from the past 240 hour(s))  MRSA PCR Screening     Status: Abnormal   Collection Time: 10/07/15 12:39 AM  Result Value Ref Range Status   MRSA by PCR POSITIVE (A) NEGATIVE Final    Comment:        The GeneXpert MRSA Assay (FDA approved for NASAL specimens only), is one component of a comprehensive MRSA colonization surveillance program. It is not intended to diagnose MRSA infection nor to guide or monitor treatment for MRSA infections. RESULT CALLED TO, READ BACK BY AND VERIFIED WITH: LEIGHT,R RN [id number] 10/07/15 MITCHELL,L      Discharge Instructions:   Discharge Instructions    Call MD for:  redness, tenderness, or signs of infection (pain, swelling, redness, odor or green/yellow discharge around incision site)    Complete by:  As directed      Call MD for:  severe uncontrolled pain    Complete by:  As directed      Call MD for:  temperature >100.4    Complete by:  As directed      Diet - low sodium heart healthy    Complete by:  As directed      Increase activity slowly    Complete by:  As directed             Medication List    STOP taking these medications        ARTIFICIAL TEARS 1.4 %  ophthalmic solution  Generic drug:  polyvinyl alcohol      TAKE these medications        acetaminophen 325 MG tablet  Commonly known as:  TYLENOL  Take 650 mg by mouth every 6 (six) hours as needed for mild pain. Reported on 10/06/2015     acetaminophen 500 MG tablet  Commonly known as:  TYLENOL  Take 500 mg by mouth 2 (two) times daily.     aspirin 81 MG tablet  Take 81 mg by mouth daily.     captopril 50 MG tablet  Commonly known as:  CAPOTEN  Take 1 tablet by mouth every 8 (eight) hours.     cholecalciferol 1000 units tablet  Commonly known as:  VITAMIN D  Take 1,000 Units by mouth daily.  fluticasone 50 MCG/ACT nasal spray  Commonly known as:  FLONASE  Place 1 spray into both nostrils daily.     gabapentin 100 MG capsule  Commonly known as:  NEURONTIN  Take 100-200 mg by mouth 3 (three) times daily. 200 mg every morning, 100 mg in the afternoon, and 100 mg at night.     HYDROcodone-acetaminophen 5-325 MG tablet  Commonly known as:  NORCO/VICODIN  Take 1 tablet by mouth every 6 (six) hours as needed for moderate pain.     levothyroxine 75 MCG tablet  Commonly known as:  SYNTHROID, LEVOTHROID  Take 1 tablet (75 mcg total) by mouth daily before breakfast.     loratadine 10 MG tablet  Commonly known as:  CLARITIN  Take 10 mg by mouth daily.     Melatonin 1 MG Tabs  Take 2 tablets by mouth at bedtime.     metoprolol succinate 50 MG 24 hr tablet  Commonly known as:  TOPROL-XL  Take 50 mg by mouth daily. Take with or immediately following a meal.     mupirocin ointment 2 %  Commonly known as:  BACTROBAN  Place 1 application into the nose 2 (two) times daily.     PRESERVISION AREDS 2 PO  Take 1 capsule by mouth 2 (two) times daily.     rosuvastatin 5 MG tablet  Commonly known as:  CRESTOR  Take 5 mg by mouth daily.     SYSTANE OP  Place 1 drop into both eyes 3 (three) times daily.           Follow-up Information    Follow up with Tami RibasKUZMA,KEVIN R, MD  In 1 week.   Specialty:  Orthopedic Surgery   Contact information:   [address] [place] [address] [phone]        Time coordinating discharge: 30 minutes.  Signed:  RAMA,CHRISTINA  Pager [phone] [facility] 10/07/2015, 11:10 AM

## 2015-10-07 NOTE — Clinical Social Work Note (Signed)
Clinical Social Work Assessment  Patient Details  Name: Grace Silva MRN: [id number] Date of Birth: 02/17/1919  Date of referral:  10/07/15               Reason for consult:  Facility Placement                Permission sought to share information with:  Facility Medical sales representativeContact Representative, Family Supports Permission granted to share information::  Yes, Verbal Permission Granted  Name::     Roddie McWarren,Faye Niece [phone] or [phone]  Agency::  [facility] admissions  Relationship::     Contact Information:     Housing/Transportation Living arrangements for the past 2 months:  Skilled Nursing Facility Source of Information:  Facility Patient Interpreter Needed:  None Criminal Activity/Legal Involvement Pertinent to Current Situation/Hospitalization:  No - Comment as needed Significant Relationships:  Other Family Members Lives with:  Facility Resident Do you feel safe going back to the place where you live?  Yes Need for family participation in patient care:  Yes (Comment) (Patient has some dementia)  Care giving concerns: Patient and family do not have any concerns or issues  Office managerocial Worker assessment / plan:  Patient is a 82 year old female who resides at [facility]  Patient is alert x1, but active.  Patient's family are the main contact her niece Lucendia HerrlichFaye is the main contact person.  Patient's niece expresses [facility] and do not have any concerns with her going back.  Patient's niece reports that patient is quite active at the facility and likes to walk around.  Patient's family expressed that she has tendency to fall sometimes because she becomes unsteady with her walker.  Patient's family feels she will benefit from some short term rehab while at the SNF.  Employment status:  Retired Database administratornsurance information:  Managed Medicare PT Recommendations:  Skilled Nursing Facility Information / Referral to community resources:      Patient/Family's Response to care:  Patient and family agreeable to returning to SNF.  Patient/Family's Understanding of and Emotional Response to Diagnosis, Current Treatment, and Prognosis:  Patient and family understand patient's diagnosis and current treatment plan.  Emotional Assessment Appearance:  Appears stated age Attitude/Demeanor/Rapport:    Affect (typically observed):  Calm, Pleasant, Appropriate Orientation:  Oriented to Self Alcohol / Substance use:  Not Applicable Psych involvement (Current and /or in the community):  No (Comment)  Discharge Needs  Concerns to be addressed:  No discharge needs identified Readmission within the last 30 days:  No Current discharge risk:  None Barriers to Discharge:  No Barriers Identified   Darleene Cleavernterhaus, Aveah Castell R, LCSWA 10/07/2015, 1:07 PM

## 2015-10-07 NOTE — Progress Notes (Signed)
Grace Silva discharged SNF per MD order. All questions and concerns answered.    [facility]. No distress noted upon discharge.   Dennard NipScott, Oshae Simmering R 10/07/2015 1:56 PM

## 2015-10-07 NOTE — Progress Notes (Signed)
OT Cancellation Note  Patient Details Name: Grace Silva MRN: [id number] DOB: 11-16-1918   Cancelled Treatment:    Reason Eval/Treat Not Completed: OT screened, no needs identified, will sign off.  Pt from SNF and returning to SNF today.  Hand is splinted, all OT needs can be deferred to SNF.   Angelene GiovanniConarpe, Rishon Thilges M  [place], OTR/L [phone]  10/07/2015, 11:31 AM

## 2015-10-07 NOTE — Clinical Social Work Note (Addendum)
Patient to be d/c'ed today to [facility].  Patient and family agreeable to plans will transport via ems RN to call report to [phone] Room 502.  Patient's nice Lucendia HerrlichFaye has been notified.  Windell MouldingEric Onyinyechi Huante, MSW, Theresia MajorsLCSWA [phone]

## 2015-10-07 NOTE — Evaluation (Signed)
Physical Therapy Evaluation Patient Details Name: Grace RoverDorothy M Lenhardt MRN: [id number] DOB: Jun 01, 1919 Today's Date: 10/07/2015   History of Present Illness  82 yo admitted from [facility] after her hand was shut in a door s/p I&D of Right hand. PMHx: HTN, dementia, blindness, pacemaker  Clinical Impression  Pt very pleasant and eager to get OOB today. Pt reports she walks at [facility] but unable to provide further PLOF and unaware of location, situation or date. Pt with decreased strength, transfers, function and mobility who will benefit from acute therapy to maximize function and decrease burden of care. Recommend daily mobility with nursing staff. Pt incontinent on arrival with assist for pericare and linen change.    Follow Up Recommendations SNF;Supervision/Assistance - 24 hour    Equipment Recommendations  None recommended by PT    Recommendations for Other Services       Precautions / Restrictions Precautions Precautions: Fall Restrictions Weight Bearing Restrictions: Yes      Mobility  Bed Mobility Overal bed mobility: Needs Assistance Bed Mobility: Supine to Sit     Supine to sit: Mod assist     General bed mobility comments: cues for sequence with increased time and assist to move lower body toward EOB and scoot fully to edge  Transfers Overall transfer level: Needs assistance   Transfers: Sit to/from Stand;Stand Pivot Transfers Sit to Stand: Mod assist Stand pivot transfers: Mod assist       General transfer comment: cues for hand placement and sequence with standing from bed x 2 with assist for anterior translation and rise from surface. Mod assist to pivot to chair with cues for hand placement and surface location Right knee blocked throughout  Ambulation/Gait                Stairs            Wheelchair Mobility    Modified Rankin (Stroke Patients Only)       Balance Overall balance assessment: Needs assistance   Sitting  balance-Leahy Scale: Fair       Standing balance-Leahy Scale: Poor                               Pertinent Vitals/Pain Pain Assessment: No/denies pain    Home Living Family/patient expects to be discharged to:: Skilled nursing facility                      Prior Function Level of Independence: Needs assistance   Gait / Transfers Assistance Needed: pt states she was walking with a walker  ADL's / Homemaking Assistance Needed: pt unable to state  Comments: pt unable to provide accurate history and family unavailable     Hand Dominance        Extremity/Trunk Assessment   Upper Extremity Assessment: Generalized weakness           Lower Extremity Assessment: Generalized weakness      Cervical / Trunk Assessment: Kyphotic  Communication   Communication: HOH  Cognition Arousal/Alertness: Awake/alert Behavior During Therapy: Flat affect Overall Cognitive Status: No family/caregiver present to determine baseline cognitive functioning                      General Comments      Exercises        Assessment/Plan    PT Assessment Patient needs continued PT services  PT Diagnosis Difficulty walking;Generalized weakness;Altered mental status  PT Problem List Decreased strength;Decreased activity tolerance;Decreased balance;Decreased mobility;Pain;Decreased knowledge of use of DME;Decreased safety awareness  PT Treatment Interventions DME instruction;Gait training;Functional mobility training;Therapeutic activities;Therapeutic exercise;Balance training;Patient/family education   PT Goals (Current goals can be found in the Care Plan section) Acute Rehab PT Goals PT Goal Formulation: Patient unable to participate in goal setting Time For Goal Achievement: 10/21/15 Potential to Achieve Goals: Fair    Frequency Min 2X/week   Barriers to discharge        Co-evaluation               End of Session Equipment Utilized During  Treatment: Gait belt Activity Tolerance: Patient tolerated treatment well Patient left: in chair;with call bell/phone within reach;with chair alarm set Nurse Communication: Mobility status    Functional Assessment Tool Used: clinical judgement Functional Limitation: Mobility: Walking and moving around Mobility: Walking and Moving Around Current Status [phone]): At least 60 percent but less than 80 percent impaired, limited or restricted Mobility: Walking and Moving Around Goal Status [phone]): At least 20 percent but less than 40 percent impaired, limited or restricted    Time: 0825-0839 PT Time Calculation (min) (ACUTE ONLY): 14 min   Charges:   PT Evaluation $PT Eval Moderate Complexity: 1 Procedure     PT G Codes:   PT G-Codes **NOT FOR INPATIENT CLASS** Functional Assessment Tool Used: clinical judgement Functional Limitation: Mobility: Walking and moving around Mobility: Walking and Moving Around Current Status ([id number]): At least 60 percent but less than 80 percent impaired, limited or restricted Mobility: Walking and Moving Around Goal Status [phone]): At least 20 percent but less than 40 percent impaired, limited or restricted    Delorse Lek 10/07/2015, 10:09 AM Delaney Meigs, PT [phone]

## 2016-08-25 ENCOUNTER — Telehealth: Payer: Self-pay | Admitting: *Deleted

## 2016-08-25 NOTE — Telephone Encounter (Signed)
Caretaker from [facility] calling asking about how to turn off a pacemaker if a patient is passing away. I made her aware that pacemakers are not typically deactivated and that they will not prolong life if a patient is actively dying. She verbalizes understanding.

## 2016-09-03 IMAGING — CT CT HEAD W/O CM
3 of 8 series · 10 of 47 positions shown, 12 images · non-contrast
Comparison: CT dated 02/19/2013

CLINICAL DATA: [AGE] female with fall

EXAM:
CT HEAD WITHOUT CONTRAST
CT MAXILLOFACIAL WITHOUT CONTRAST
TECHNIQUE: Multidetector CT imaging of the head and maxillofacial structures
were performed using the standard protocol without intravenous
contrast. Multiplanar CT image reconstructions of the maxillofacial
structures were also generated.

[Series 3010: cor st · coronal · 0.31mm/px · 2 of 59 slices shown]
[im 20/59  brain]
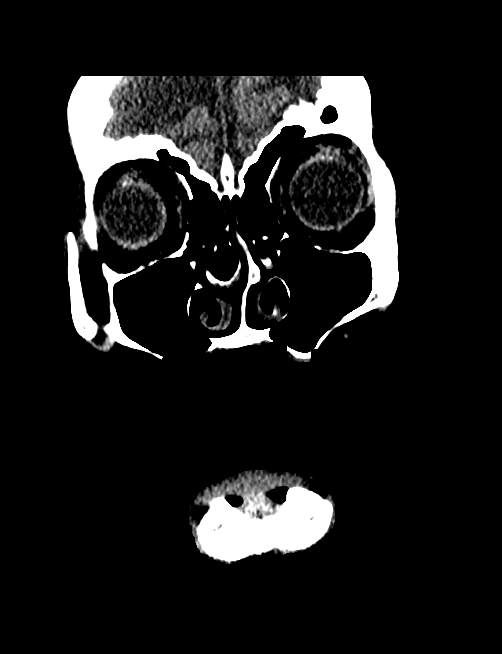
[im 39/59  brain]
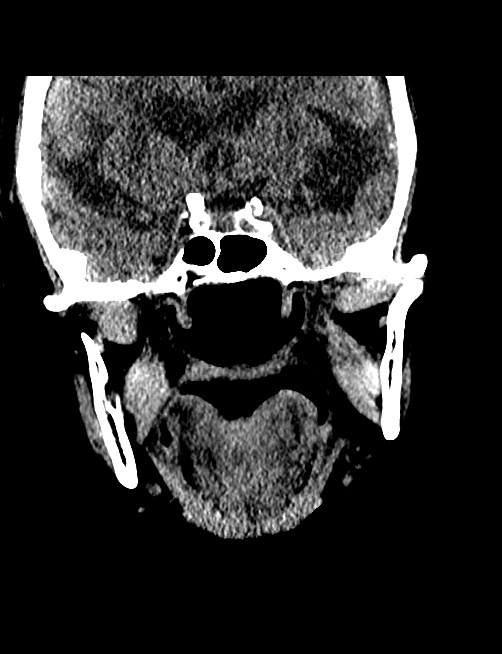

[Series 3011: sag st · sagittal · 0.31mm/px · 1 of 69 slices shown]
[im 35/69  brain]
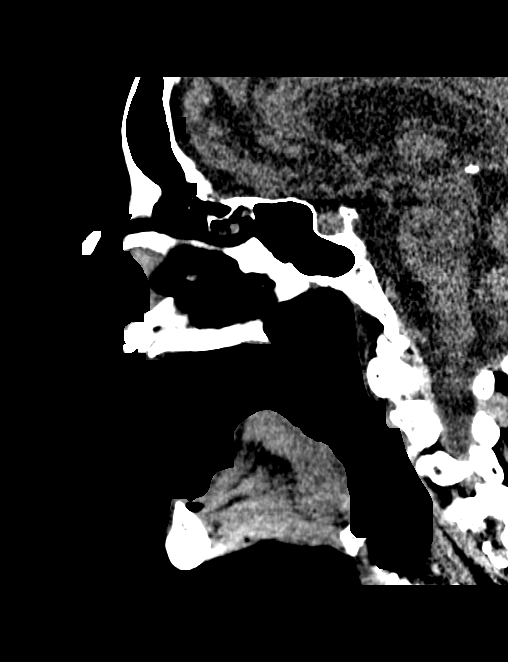

[Series 3016: reformatted ax · axial · 0.31mm/px · z∈[+887,+991]mm · 7 of 74 slices shown, 9 images]
[im 10/74  brain]
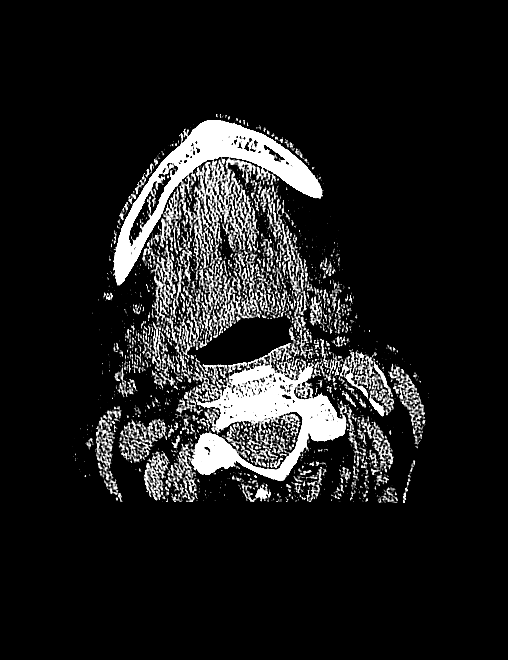
[im 10/74  bone]
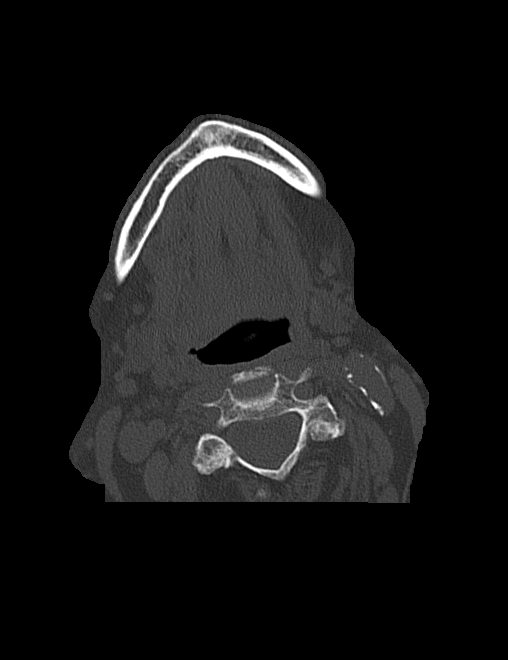
[im 19/74  brain]
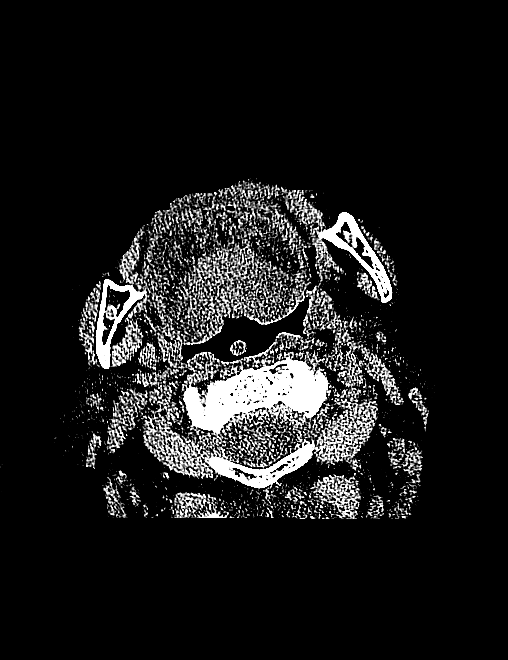
[im 28/74  brain]
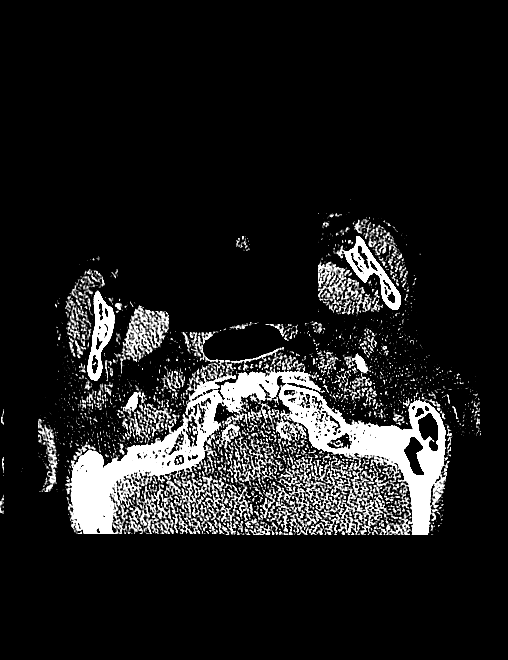
[im 37/74  brain]
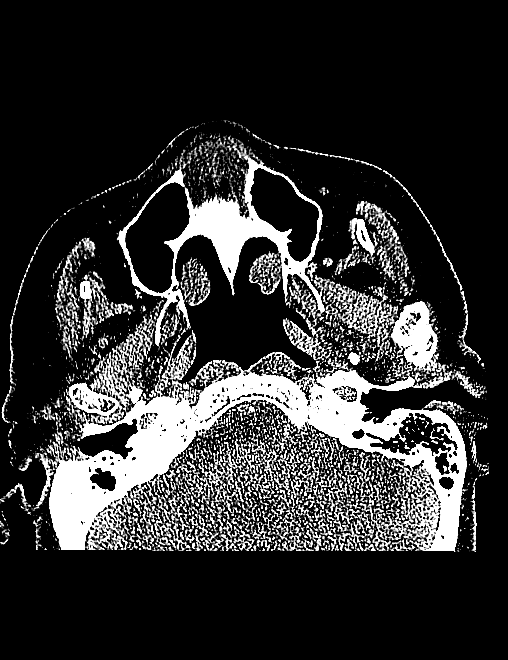
[im 46/74  brain]
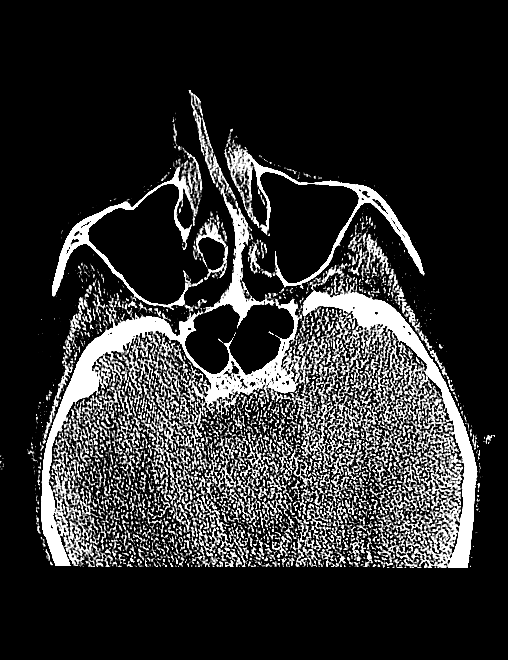
[im 46/74  bone]
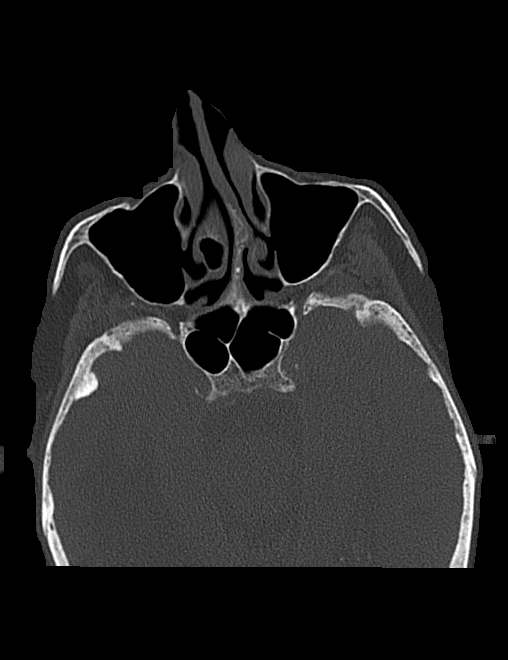
[im 55/74  brain]
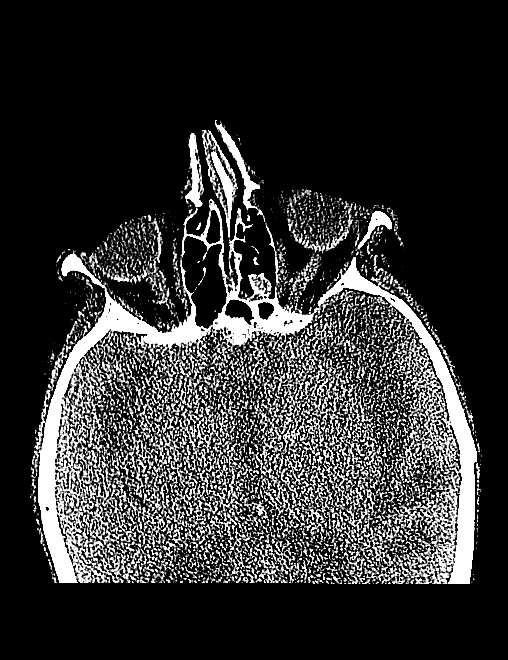
[im 64/74  brain]
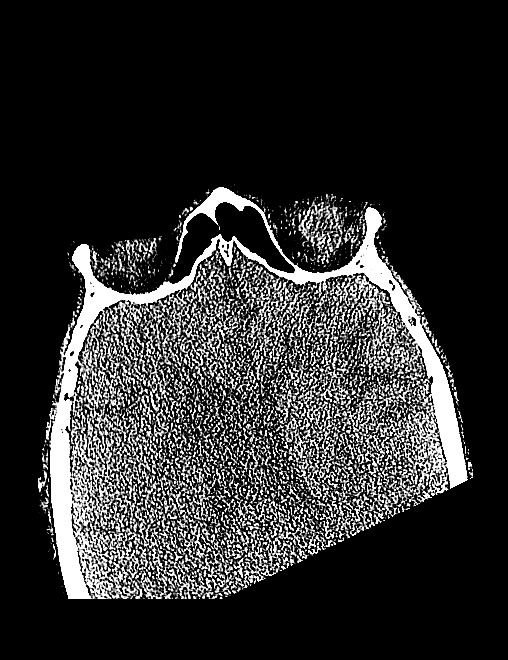

[10 of 47 positions shown; findings below may reference images not displayed]

FINDINGS: CT HEAD FINDINGS

There is age-related atrophy and chronic microvascular ischemic
disease. Large area of old infarct and encephalomalacia noted in the
right temporal and occipital lobes. There has been interval
evolution of the right basal ganglia infarct with encephalomalacia
and ex vacuo dilatation of the right lateral ventricle. There is no
acute intracranial hemorrhage. No mass effect or midline shift
identified.

The visualized paranasal sinuses and mastoid air cells are clear.
The calvarium is intact.

CT MAXILLOFACIAL FINDINGS

There is old fracture of the left mandibular condyle. No acute
fracture identified. There is partial opacification of multiple
ethmoid air cells. The remainder of paranasal sinuses and mastoid
air cells are well aerated. Postsurgical changes of cataract
surgery. The soft tissues appear unremarkable.
IMPRESSION: No acute intracranial hemorrhage.

Age-related atrophy and chronic microvascular ischemic disease. Old
right temporal and occipital infarct and encephalomalacia.

No acute facial fractures.

## 2016-09-03 DEATH — deceased

## 2016-09-28 IMAGING — DX DG HAND COMPLETE 3+V*R*
3 series · 3 of 3 positions shown · non-contrast
Comparison: None.

CLINICAL DATA: Right third and fourth fingers caught in wheelchair
with laceration to the tips

EXAM:
RIGHT HAND - COMPLETE 3+ VIEW

[hand pa]
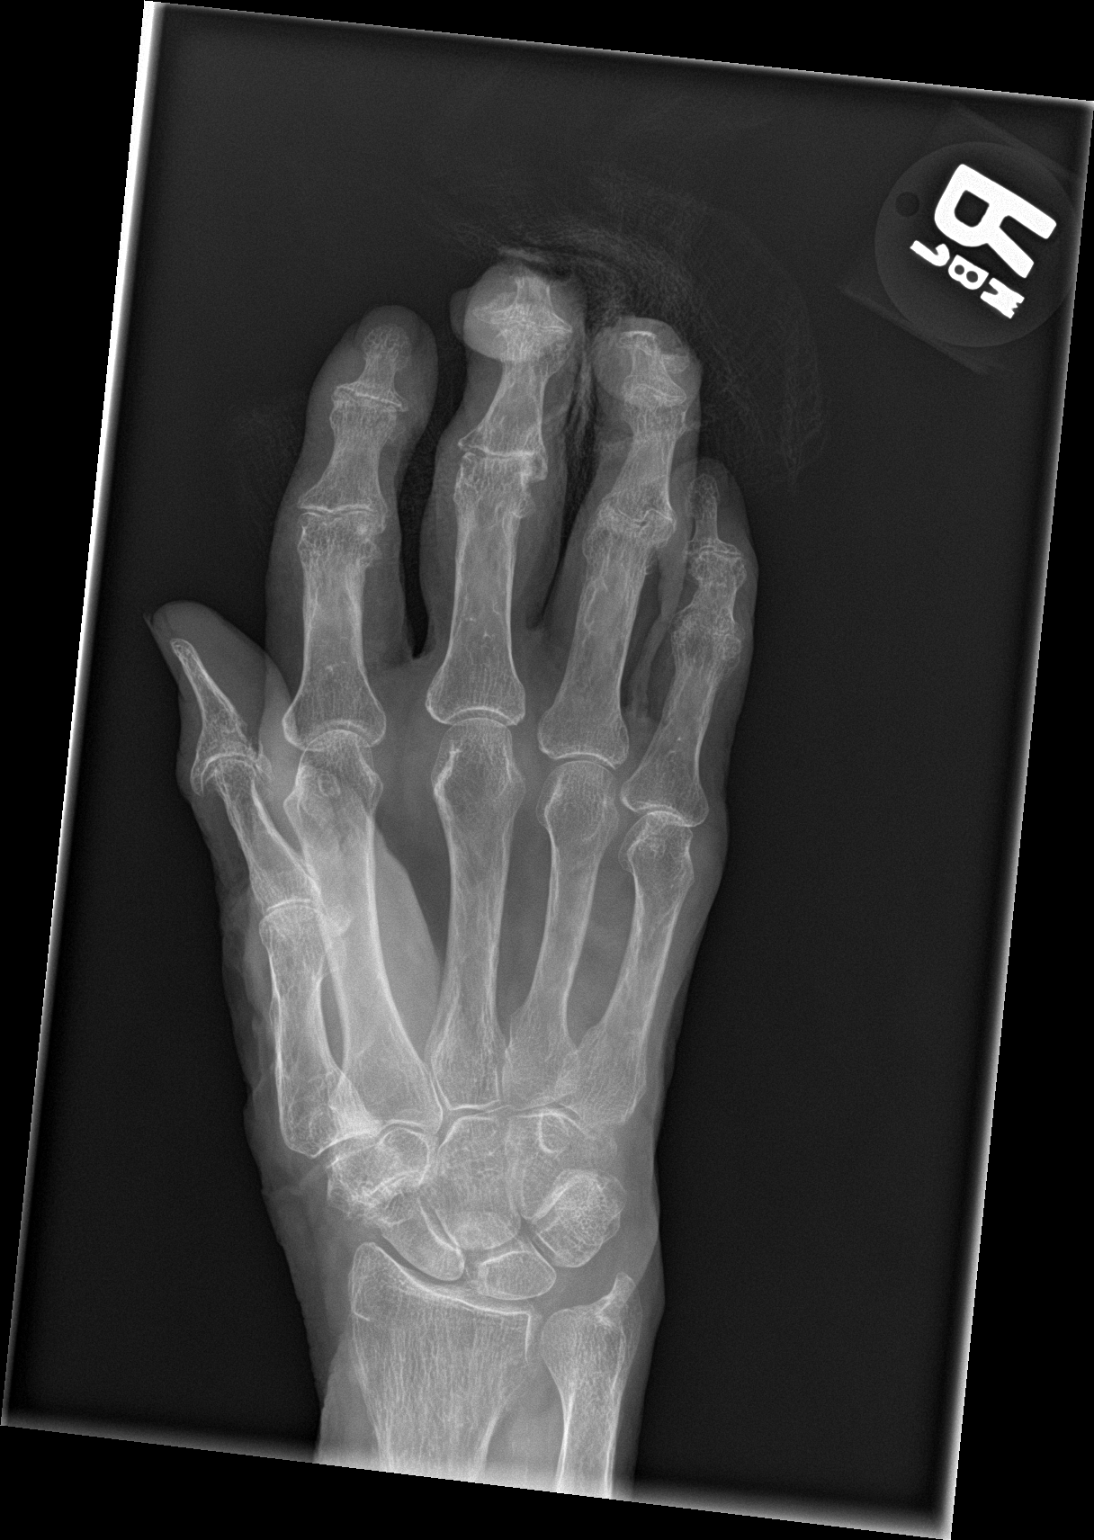

[hand obl]
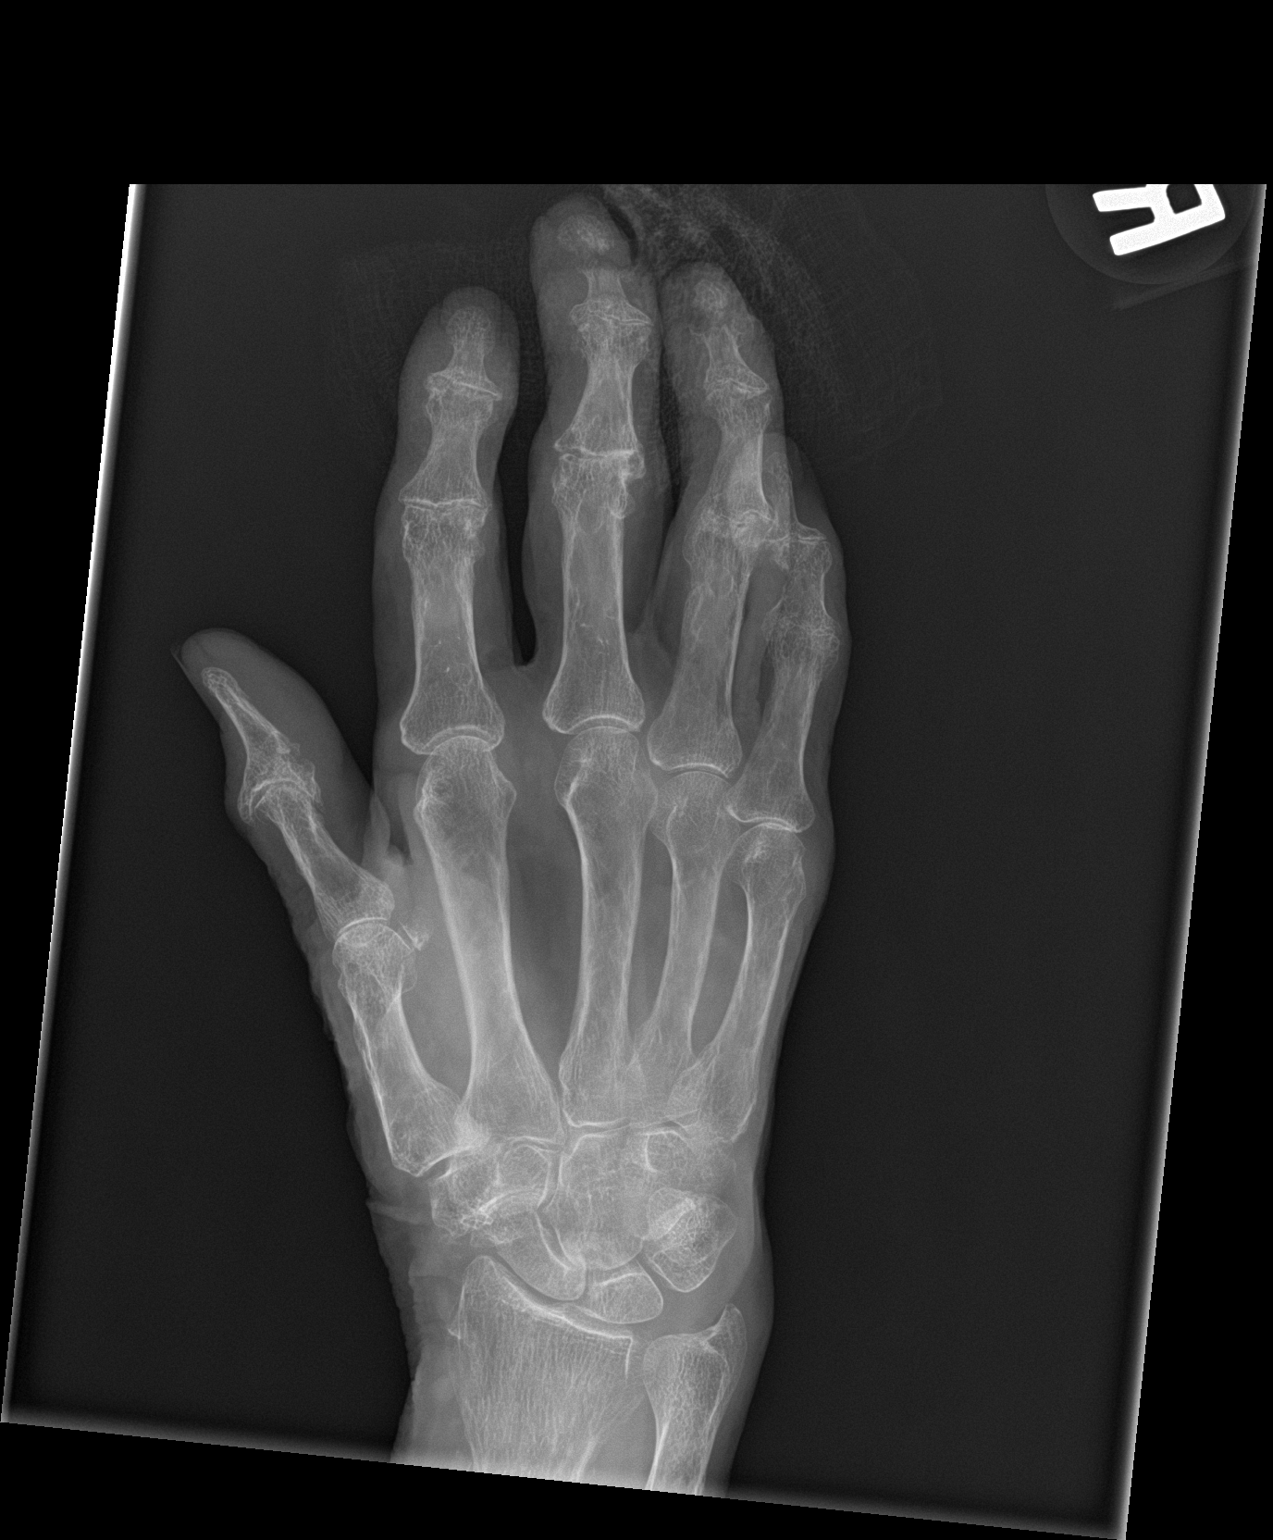

[hand lat]
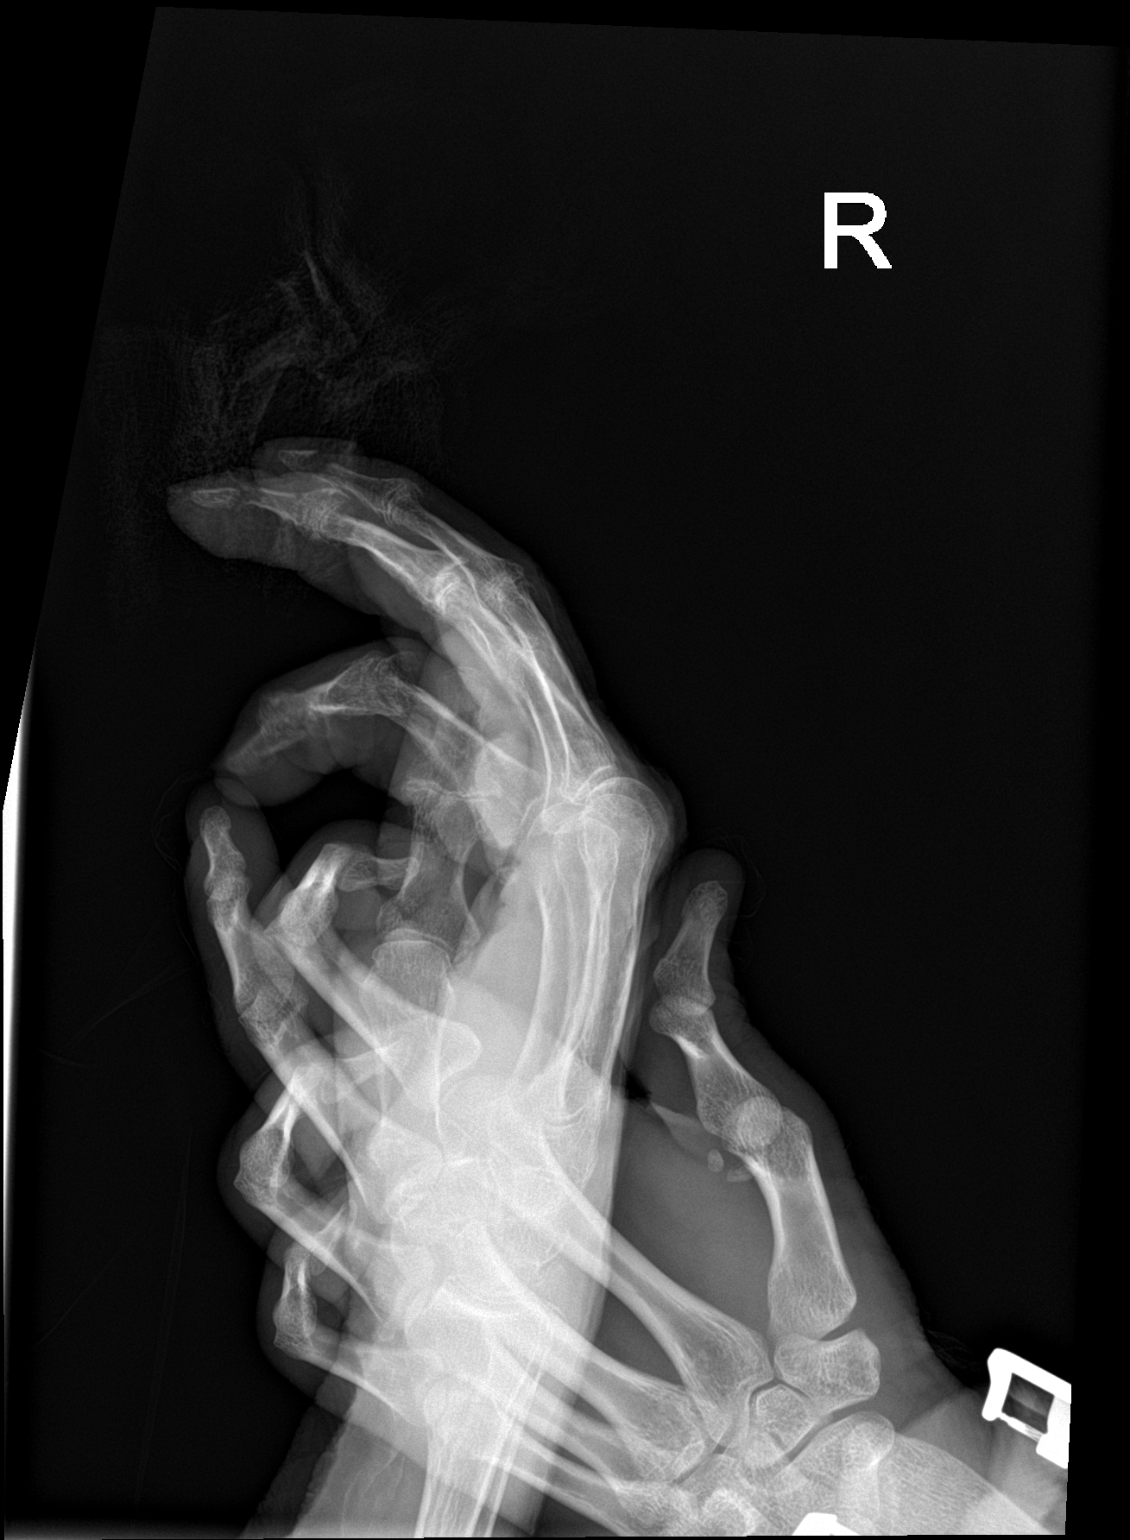

[3 of 3 positions shown; findings below may reference images not displayed]

FINDINGS: Limited positioning.  Diffuse osteopenia and arthritic change.

Soft tissue injury to the tips of the third and fourth fingers.
Mildly displaced fractures involve third and fourth distal
phalanges, with distal fracture fragment is displaced distally by
about 4 mm.
IMPRESSION: Fractures of the third and fourth distal phalanges
